# Patient Record
Sex: Female | Born: 1955 | Race: White | Hispanic: No | Marital: Married | Smoking: Former smoker
Health system: Southern US, Community
[De-identification: ages and names within clinical notes are randomized; demographics above are authoritative.]

## PROBLEM LIST (undated history)

## (undated) DIAGNOSIS — E038 Other specified hypothyroidism: Secondary | ICD-10-CM

## (undated) DIAGNOSIS — E782 Mixed hyperlipidemia: Secondary | ICD-10-CM

## (undated) HISTORY — DX: Other specified hypothyroidism: E03.8

## (undated) HISTORY — DX: Mixed hyperlipidemia: E78.2

---

## 1989-09-03 HISTORY — PX: CHOLECYSTECTOMY: SHX55

## 1991-09-04 HISTORY — PX: HYSTERECTOMY ABDOMINAL WITH SALPINGECTOMY: SHX6725

## 1998-08-11 ENCOUNTER — Encounter: Payer: Self-pay | Admitting: Obstetrics & Gynecology

## 1998-08-11 ENCOUNTER — Ambulatory Visit (HOSPITAL_COMMUNITY): Admission: RE | Admit: 1998-08-11 | Discharge: 1998-08-11 | Payer: Self-pay | Admitting: Obstetrics & Gynecology

## 1998-10-10 ENCOUNTER — Encounter: Payer: Self-pay | Admitting: *Deleted

## 1998-10-10 ENCOUNTER — Ambulatory Visit (HOSPITAL_COMMUNITY): Admission: RE | Admit: 1998-10-10 | Discharge: 1998-10-10 | Payer: Self-pay | Admitting: *Deleted

## 1999-03-01 ENCOUNTER — Ambulatory Visit (HOSPITAL_COMMUNITY): Admission: RE | Admit: 1999-03-01 | Discharge: 1999-03-01 | Payer: Self-pay | Admitting: Obstetrics & Gynecology

## 2000-02-26 ENCOUNTER — Ambulatory Visit (HOSPITAL_COMMUNITY): Admission: RE | Admit: 2000-02-26 | Discharge: 2000-02-26 | Payer: Self-pay | Admitting: Obstetrics and Gynecology

## 2000-02-26 ENCOUNTER — Encounter: Payer: Self-pay | Admitting: Obstetrics and Gynecology

## 2001-02-27 ENCOUNTER — Ambulatory Visit (HOSPITAL_COMMUNITY): Admission: RE | Admit: 2001-02-27 | Discharge: 2001-02-27 | Payer: Self-pay | Admitting: Obstetrics and Gynecology

## 2001-02-27 ENCOUNTER — Encounter: Payer: Self-pay | Admitting: Obstetrics and Gynecology

## 2002-03-20 ENCOUNTER — Ambulatory Visit (HOSPITAL_COMMUNITY): Admission: RE | Admit: 2002-03-20 | Discharge: 2002-03-20 | Payer: Self-pay | Admitting: Obstetrics and Gynecology

## 2002-03-20 ENCOUNTER — Encounter: Payer: Self-pay | Admitting: Obstetrics and Gynecology

## 2003-03-22 ENCOUNTER — Encounter: Payer: Self-pay | Admitting: Obstetrics and Gynecology

## 2003-03-22 ENCOUNTER — Ambulatory Visit (HOSPITAL_COMMUNITY): Admission: RE | Admit: 2003-03-22 | Discharge: 2003-03-22 | Payer: Self-pay | Admitting: Obstetrics and Gynecology

## 2003-09-04 HISTORY — PX: KNEE SURGERY: SHX244

## 2004-04-07 ENCOUNTER — Ambulatory Visit (HOSPITAL_COMMUNITY): Admission: RE | Admit: 2004-04-07 | Discharge: 2004-04-07 | Payer: Self-pay | Admitting: Obstetrics and Gynecology

## 2005-04-13 ENCOUNTER — Ambulatory Visit (HOSPITAL_COMMUNITY): Admission: RE | Admit: 2005-04-13 | Discharge: 2005-04-13 | Payer: Self-pay | Admitting: Obstetrics and Gynecology

## 2006-04-19 ENCOUNTER — Ambulatory Visit (HOSPITAL_COMMUNITY): Admission: RE | Admit: 2006-04-19 | Discharge: 2006-04-19 | Payer: Self-pay | Admitting: Obstetrics and Gynecology

## 2006-04-26 ENCOUNTER — Encounter: Admission: RE | Admit: 2006-04-26 | Discharge: 2006-04-26 | Payer: Self-pay | Admitting: Obstetrics and Gynecology

## 2006-10-11 ENCOUNTER — Encounter: Admission: RE | Admit: 2006-10-11 | Discharge: 2006-10-11 | Payer: Self-pay | Admitting: Obstetrics and Gynecology

## 2007-04-25 ENCOUNTER — Encounter: Admission: RE | Admit: 2007-04-25 | Discharge: 2007-04-25 | Payer: Self-pay | Admitting: Obstetrics and Gynecology

## 2008-03-30 ENCOUNTER — Ambulatory Visit (HOSPITAL_COMMUNITY): Admission: RE | Admit: 2008-03-30 | Discharge: 2008-03-30 | Payer: Self-pay | Admitting: Obstetrics and Gynecology

## 2008-04-30 ENCOUNTER — Encounter: Admission: RE | Admit: 2008-04-30 | Discharge: 2008-04-30 | Payer: Self-pay | Admitting: Obstetrics and Gynecology

## 2009-05-13 ENCOUNTER — Encounter: Admission: RE | Admit: 2009-05-13 | Discharge: 2009-05-13 | Payer: Self-pay | Admitting: Obstetrics and Gynecology

## 2009-12-13 IMAGING — CT CT ABDOMEN W/ CM
2 of 5 series · 17 of 46 positions shown, 19 images · IV contrast (agent unspecified)
Comparison: None

CT ABDOMEN

CLINICAL DATA: Left lower quadrant abdominal and pelvic pain.

CT ABDOMEN AND PELVIS WITH CONTRAST
TECHNIQUE: Multidetector CT imaging of the abdomen and pelvis was
performed using the standard protocol following bolus
administration of intravenous contrast.
Contrast: 100 ml Optiray 300 and oral contrast

[Series 2: abd_pel 5.0 b40s · axial · 0.68mm/px · z∈[-462,-86]mm · 14 of 85 slices shown, 16 images]
[im 5/85  soft-tissue]
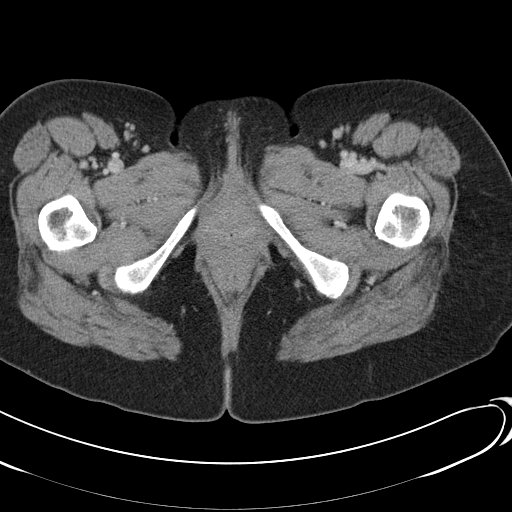
[im 5/85  bone]
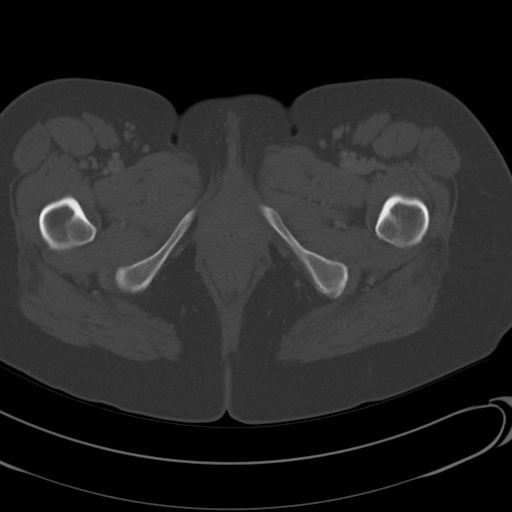
[im 10/85  soft-tissue]
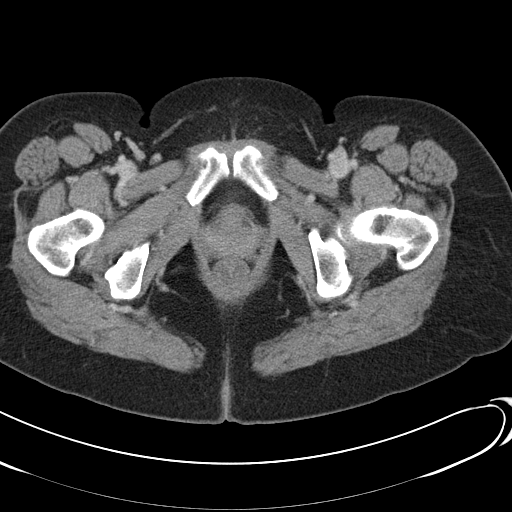
[im 19/85  soft-tissue]
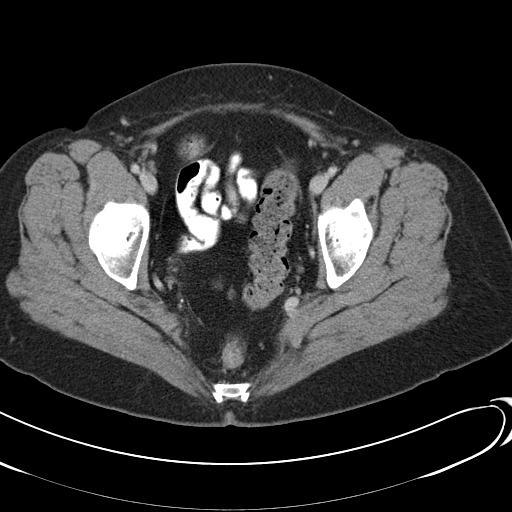
[im 24/85  soft-tissue]
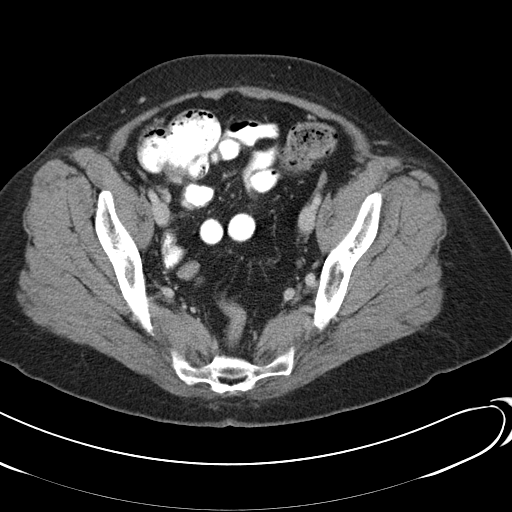
[im 29/85  soft-tissue]
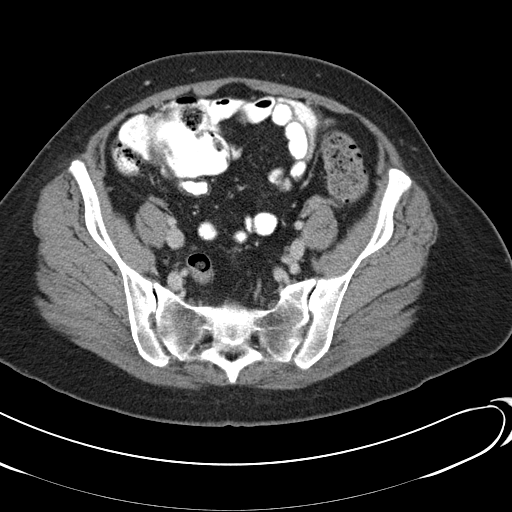
[im 33/85  soft-tissue]
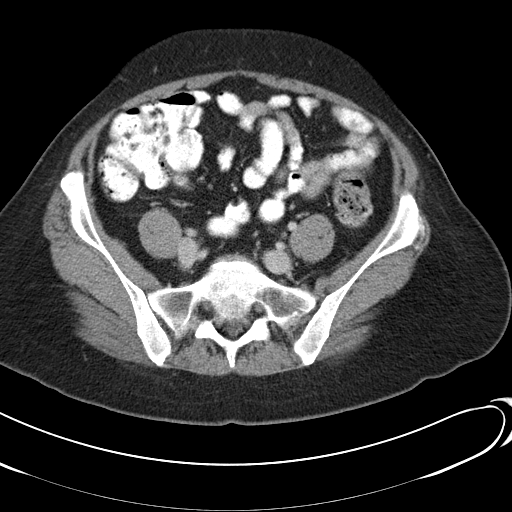
[im 38/85  soft-tissue]
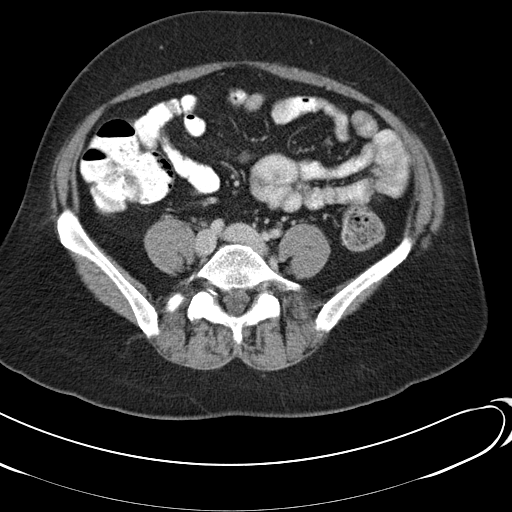
[im 47/85  soft-tissue]
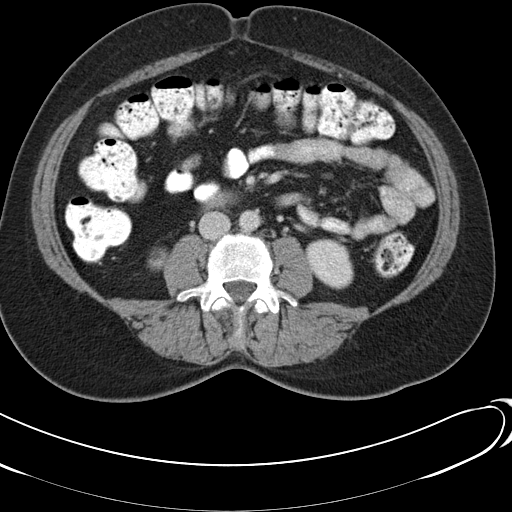
[im 52/85  soft-tissue]
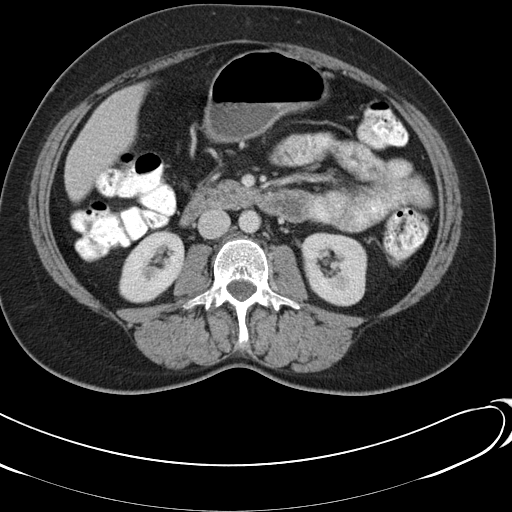
[im 52/85  bone]
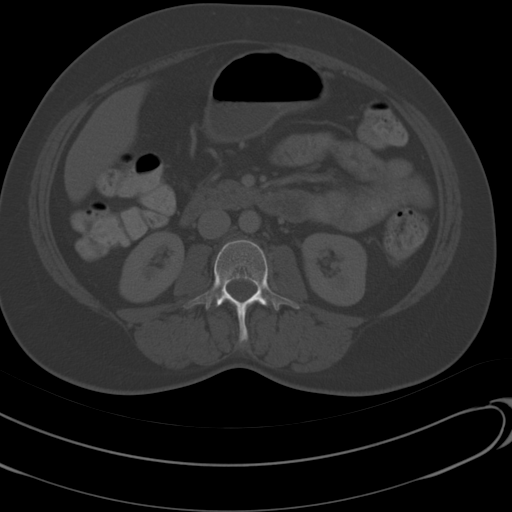
[im 57/85  soft-tissue]
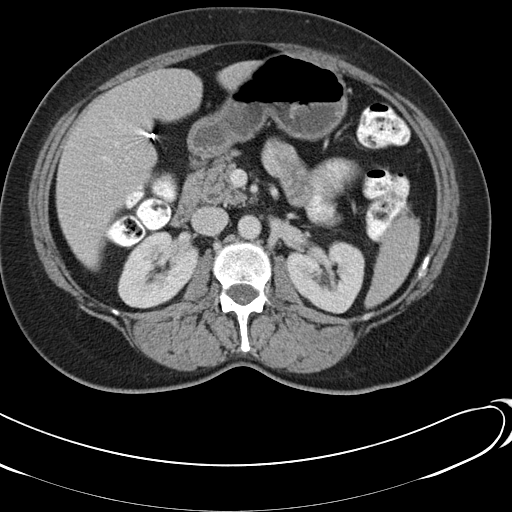
[im 61/85  soft-tissue]
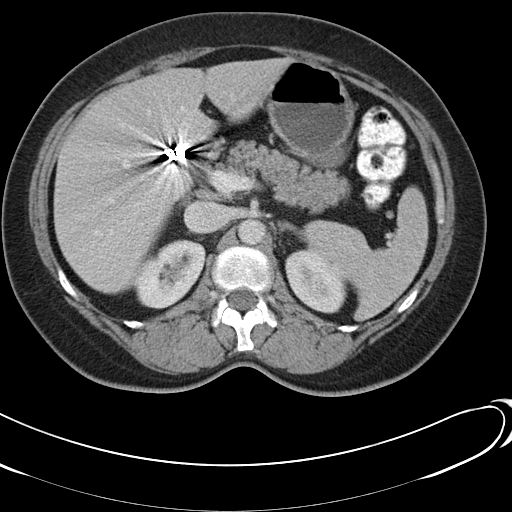
[im 66/85  soft-tissue]
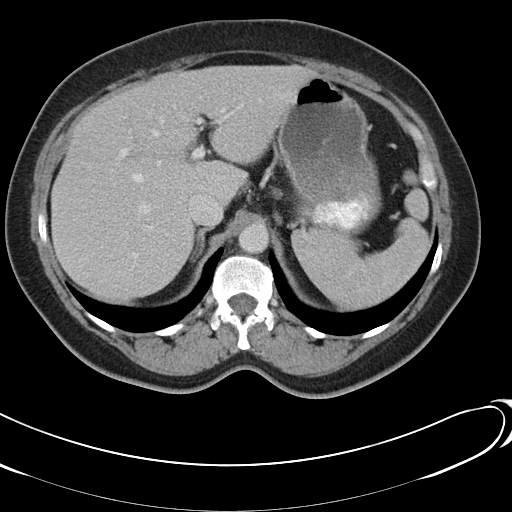
[im 75/85  soft-tissue]
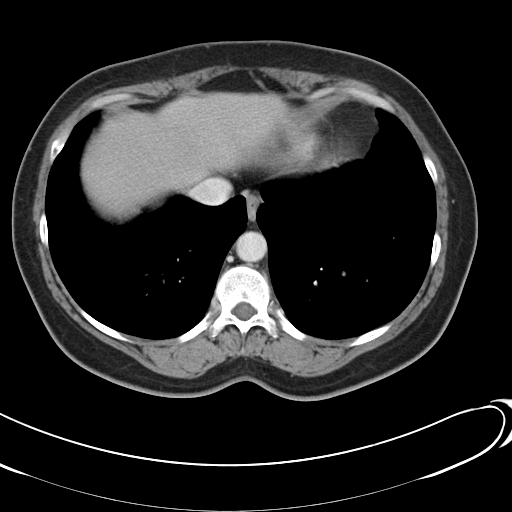
[im 80/85  soft-tissue]
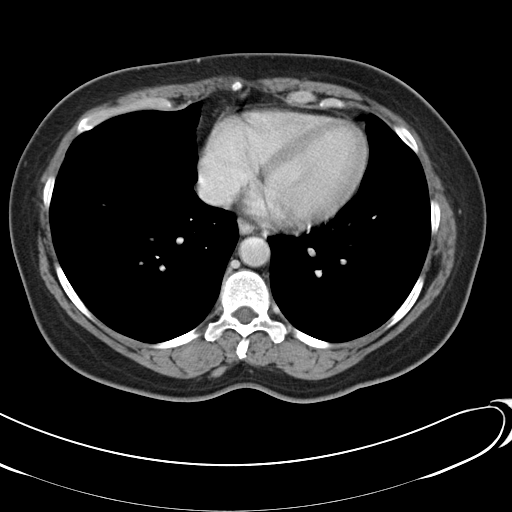

[Series 602: <mpr thick range> · coronal · 0.83mm/px · 3 of 86 slices shown]
[im 29/86  soft-tissue]
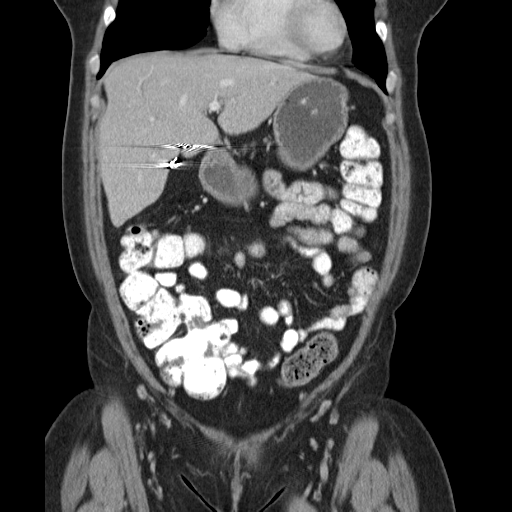
[im 38/86  soft-tissue]
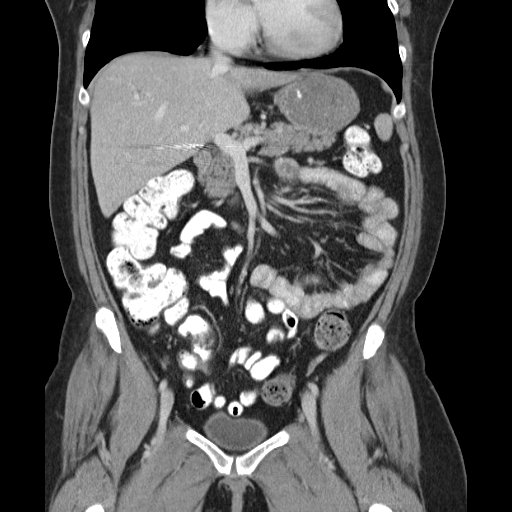
[im 48/86  soft-tissue]
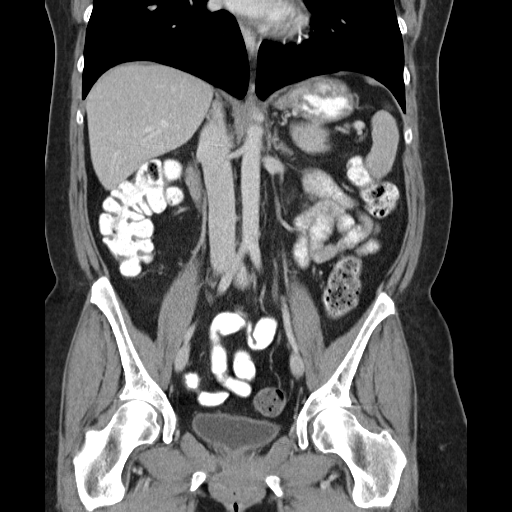

[17 of 46 positions shown; findings below may reference images not displayed]

FINDINGS: Surgical clips are seen from prior cholecystectomy.
There is no evidence of biliary ductal dilatation.  The liver,
spleen, pancreas, adrenal glands, and kidneys are normal
appearance.

There is no evidence of abdominal soft tissue masses or
lymphadenopathy.  There is no evidence of inflammatory process or
abnormal fluid collections.  Abdominal bowel loops are
unremarkable.
IMPRESSION: Negative abdomen CT.  Prior cholecystectomy noted.

CT PELVIS
FINDINGS: There are is no evidence of pelvic mass or
lymphadenopathy.  There is no evidence of inflammatory process or
abnormal fluid collections.  The patient has undergone previous
hysterectomy.  Pelvic bowel loops and other organs are unremarkable
in appearance.
IMPRESSION: Negative pelvis CT.  Prior hysterectomy noted.

## 2010-05-19 ENCOUNTER — Encounter: Admission: RE | Admit: 2010-05-19 | Discharge: 2010-05-19 | Payer: Self-pay | Admitting: Obstetrics and Gynecology

## 2010-09-24 ENCOUNTER — Encounter: Payer: Self-pay | Admitting: Rheumatology

## 2011-04-17 ENCOUNTER — Other Ambulatory Visit: Payer: Self-pay | Admitting: Obstetrics and Gynecology

## 2011-04-17 DIAGNOSIS — Z1231 Encounter for screening mammogram for malignant neoplasm of breast: Secondary | ICD-10-CM

## 2011-05-25 ENCOUNTER — Ambulatory Visit
Admission: RE | Admit: 2011-05-25 | Discharge: 2011-05-25 | Disposition: A | Discharge status: Home / Self Care | Payer: BC Managed Care – PPO | Source: Ambulatory Visit | Attending: Obstetrics and Gynecology | Admitting: Obstetrics and Gynecology

## 2011-05-25 DIAGNOSIS — Z1231 Encounter for screening mammogram for malignant neoplasm of breast: Secondary | ICD-10-CM

## 2012-05-15 ENCOUNTER — Other Ambulatory Visit: Payer: Self-pay | Admitting: Obstetrics and Gynecology

## 2012-05-15 DIAGNOSIS — Z1231 Encounter for screening mammogram for malignant neoplasm of breast: Secondary | ICD-10-CM

## 2012-06-13 ENCOUNTER — Ambulatory Visit
Admission: RE | Admit: 2012-06-13 | Discharge: 2012-06-13 | Disposition: A | Discharge status: Home / Self Care | Payer: BC Managed Care – PPO | Source: Ambulatory Visit | Attending: Obstetrics and Gynecology | Admitting: Obstetrics and Gynecology

## 2012-06-13 DIAGNOSIS — Z1231 Encounter for screening mammogram for malignant neoplasm of breast: Secondary | ICD-10-CM

## 2013-05-11 ENCOUNTER — Other Ambulatory Visit: Payer: Self-pay

## 2013-05-11 DIAGNOSIS — Z1231 Encounter for screening mammogram for malignant neoplasm of breast: Secondary | ICD-10-CM

## 2013-06-19 ENCOUNTER — Ambulatory Visit
Admission: RE | Admit: 2013-06-19 | Discharge: 2013-06-19 | Disposition: A | Discharge status: Home / Self Care | Payer: BC Managed Care – PPO | Source: Ambulatory Visit

## 2013-06-19 DIAGNOSIS — Z1231 Encounter for screening mammogram for malignant neoplasm of breast: Secondary | ICD-10-CM

## 2014-06-22 ENCOUNTER — Other Ambulatory Visit: Payer: Self-pay

## 2014-06-22 DIAGNOSIS — Z1239 Encounter for other screening for malignant neoplasm of breast: Secondary | ICD-10-CM

## 2014-07-14 ENCOUNTER — Other Ambulatory Visit: Payer: Self-pay

## 2014-07-14 DIAGNOSIS — Z1231 Encounter for screening mammogram for malignant neoplasm of breast: Secondary | ICD-10-CM

## 2014-07-15 ENCOUNTER — Ambulatory Visit
Admission: RE | Admit: 2014-07-15 | Discharge: 2014-07-15 | Disposition: A | Discharge status: Home / Self Care | Payer: BC Managed Care – PPO | Source: Ambulatory Visit

## 2014-07-15 DIAGNOSIS — Z1231 Encounter for screening mammogram for malignant neoplasm of breast: Secondary | ICD-10-CM

## 2016-05-08 LAB — HM COLONOSCOPY

## 2019-05-31 DIAGNOSIS — U071 COVID-19: Secondary | ICD-10-CM

## 2019-05-31 HISTORY — DX: COVID-19: U07.1

## 2019-12-08 ENCOUNTER — Other Ambulatory Visit: Payer: Self-pay

## 2019-12-08 MED ORDER — LEVOTHYROXINE SODIUM 100 MCG PO TABS: 100.0000 ug | ORAL_TABLET | Freq: Every day | ORAL | 0 refills | Status: DC

## 2020-03-28 ENCOUNTER — Other Ambulatory Visit: Payer: Self-pay

## 2020-03-28 MED ORDER — LEVOTHYROXINE SODIUM 100 MCG PO TABS: 100.0000 ug | ORAL_TABLET | Freq: Every day | ORAL | 0 refills | Status: DC

## 2020-03-30 ENCOUNTER — Ambulatory Visit (INDEPENDENT_AMBULATORY_CARE_PROVIDER_SITE_OTHER): Payer: Self-pay | Admitting: Legal Medicine

## 2020-03-30 ENCOUNTER — Other Ambulatory Visit: Payer: Self-pay

## 2020-03-30 ENCOUNTER — Encounter: Payer: Self-pay | Admitting: Legal Medicine

## 2020-03-30 DIAGNOSIS — E782 Mixed hyperlipidemia: Secondary | ICD-10-CM | POA: Insufficient documentation

## 2020-03-30 DIAGNOSIS — E038 Other specified hypothyroidism: Secondary | ICD-10-CM

## 2020-03-30 NOTE — Progress Notes (Signed)
Subjective:  Patient ID: Molly Carroll, female    DOB: May 23, 1956  Age: 64 y.o. MRN: [id number]  Chief Complaint  Patient presents with  . Hypothyroidism  . Hyperlipidemia    HPI: chronic visit  Patient presents with hyperlipidemia.  Compliance with treatment has been good; patient takes medicines as directed, maintains low cholesterol diet, follows up as directed, and maintains exercise regimen.  Patient is using diet and exercise without problems.  Patient has HYPOTHYROIDISM.  Diagnosed 10 years ago.  Patient has stable thyroid readings.  Patient is having stable.  Last TSH was norma.  continue dosage of thyroid medicine.   Current Outpatient Medications on File Prior to Visit  Medication Sig Dispense Refill  . levothyroxine (SYNTHROID) 100 MCG tablet Take 1 tablet (100 mcg total) by mouth daily before breakfast. 90 tablet 0   No current facility-administered medications on file prior to visit.   Past Medical History:  Diagnosis Date  . COVID-19 05/31/2019  . Mixed hyperlipidemia   . Other specified hypothyroidism    Past Surgical History:  Procedure Laterality Date  . CHOLECYSTECTOMY  1991  . HYSTERECTOMY ABDOMINAL WITH SALPINGECTOMY  1993  . KNEE SURGERY Left 2005    Family History  Problem Relation Age of Onset  . Aortic aneurysm Mother   . Prostate cancer Father    Social History   Socioeconomic History  . Marital status: Married    Spouse name: Not on file  . Number of children: 2  . Years of education: Not on file  . Highest education level: Not on file  Occupational History  . Occupation: Housewife  Tobacco Use  . Smoking status: Former Smoker    Types: Cigarettes    Quit date: 2007    Years since quitting: 14.5  . Smokeless tobacco: Never Used  Substance and Sexual Activity  . Alcohol use: Not Currently  . Drug use: Never  . Sexual activity: Yes    Partners: Male  Other Topics Concern  . Not on file  Social History Narrative  . Not on file    Social Determinants of Health   Financial Resource Strain:   . Difficulty of Paying Living Expenses:   Food Insecurity:   . Worried About Programme researcher, broadcasting/film/video in the Last Year:   . Barista in the Last Year:   Transportation Needs:   . Freight forwarder (Medical):   Marland Kitchen Lack of Transportation (Non-Medical):   Physical Activity:   . Days of Exercise per Week:   . Minutes of Exercise per Session:   Stress:   . Feeling of Stress :   Social Connections:   . Frequency of Communication with Friends and Family:   . Frequency of Social Gatherings with Friends and Family:   . Attends Religious Services:   . Active Member of Clubs or Organizations:   . Attends Banker Meetings:   Marland Kitchen Marital Status:     Review of Systems  Constitutional: Negative.   HENT: Negative.   Eyes: Negative.   Respiratory: Negative.   Cardiovascular: Negative.   Gastrointestinal: Negative.   Endocrine: Negative.   Genitourinary: Negative.   Musculoskeletal: Negative.   Skin: Negative.   Neurological: Negative.   Psychiatric/Behavioral: Negative.      Objective:  BP (!) 112/60 (BP Location: Right Arm, Patient Position: Sitting)   Pulse 88   Temp (!) 97.5 F (36.4 C) (Temporal)   Resp 16   Ht 5\' 2"  (1.575 m)  Wt 159 lb (72.1 kg)   SpO2 95%   BMI 29.08 kg/m   BP/Weight 03/30/2020  Systolic BP 112  Diastolic BP 60  Wt. (Lbs) 159  BMI 29.08    Physical Exam Vitals reviewed.  Constitutional:      Appearance: Normal appearance.  HENT:     Right Ear: Tympanic membrane, ear canal and external ear normal.     Left Ear: Tympanic membrane, ear canal and external ear normal.  Eyes:     Extraocular Movements: Extraocular movements intact.     Conjunctiva/sclera: Conjunctivae normal.     Pupils: Pupils are equal, round, and reactive to light.  Cardiovascular:     Rate and Rhythm: Normal rate and regular rhythm.     Pulses: Normal pulses.     Heart sounds: Normal heart  sounds.  Pulmonary:     Effort: Pulmonary effort is normal.     Breath sounds: Normal breath sounds.  Abdominal:     General: Abdomen is flat. Bowel sounds are normal.     Palpations: Abdomen is soft.  Musculoskeletal:        General: Normal range of motion.     Cervical back: Normal range of motion and neck supple.  Skin:    General: Skin is warm and dry.     Capillary Refill: Capillary refill takes less than 2 seconds.  Neurological:     General: No focal deficit present.     Mental Status: She is alert and oriented to person, place, and time.  Psychiatric:        Mood and Affect: Mood normal.        Thought Content: Thought content normal.     Diabetic Foot Exam - Simple   No data filed       No results found for: WBC, HGB, HCT, PLT, GLUCOSE, CHOL, TRIG, HDL, LDLDIRECT, LDLCALC, ALT, AST, NA, K, CL, CREATININE, BUN, CO2, TSH, PSA, INR, GLUF, HGBA1C, MICROALBUR    Assessment & Plan:   1. Other specified hypothyroidism - TSH Patient is known to have hypothroidism and is n treatment with levothyroxine .  Patient was diagnosed 10 years ago.  Other treatment includes none.  Patient is compliant with medicines and last TSH 6 months ago.  Last TSH was normal.  2. Mixed hyperlipidemia - Lipid Panel - Comprehensive metabolic panel AN INDIVIDUAL CARE PLAN for hyperlipidemia/ cholesterol was established and reinforced today.  The patient's status was assessed using clinical findings on exam, lab and other diagnostic tests. The patient's disease status was assessed based on evidence-based guidelines and found to be well controlled. MEDICATIONS were reviewed. SELF MANAGEMENT GOALS have been discussed and patient's success at attaining the goal of low cholesterol was assessed. RECOMMENDATION given include regular exercise 3 days a week and low cholesterol/low fat diet. CLINICAL SUMMARY including written plan to identify barriers unique to the patient due to social or economic   reasons was discussed.      Orders Placed This Encounter  Procedures  . TSH  . Lipid Panel  . Comprehensive metabolic panel     Follow-up: No follow-ups on file.  An After Visit Summary was printed and given to the patient.  Brent Bulla [facility] [phone]

## 2020-03-31 LAB — CBC WITH DIFFERENTIAL/PLATELET
Basophils Absolute: 0 10*3/uL (ref 0.0–0.2)
Basos: 1 %
EOS (ABSOLUTE): 0.1 10*3/uL (ref 0.0–0.4)
Eos: 1 %
Hematocrit: 40.1 % (ref 34.0–46.6)
Hemoglobin: 13.6 g/dL (ref 11.1–15.9)
Immature Grans (Abs): 0.1 10*3/uL (ref 0.0–0.1)
Immature Granulocytes: 1 %
Lymphocytes Absolute: 3.2 10*3/uL — ABNORMAL HIGH (ref 0.7–3.1)
Lymphs: 40 %
MCH: 30 pg (ref 26.6–33.0)
MCHC: 33.9 g/dL (ref 31.5–35.7)
MCV: 89 fL (ref 79–97)
Monocytes Absolute: 0.8 10*3/uL (ref 0.1–0.9)
Monocytes: 9 %
Neutrophils Absolute: 3.9 10*3/uL (ref 1.4–7.0)
Neutrophils: 48 %
Platelets: 341 10*3/uL (ref 150–450)
RBC: 4.53 x10E6/uL (ref 3.77–5.28)
RDW: 13 % (ref 11.7–15.4)
WBC: 8 10*3/uL (ref 3.4–10.8)

## 2020-03-31 LAB — COMPREHENSIVE METABOLIC PANEL
ALT: 18 IU/L (ref 0–32)
AST: 20 IU/L (ref 0–40)
Albumin/Globulin Ratio: 1.6 (ref 1.2–2.2)
Albumin: 4.7 g/dL (ref 3.8–4.8)
Alkaline Phosphatase: 80 IU/L (ref 48–121)
BUN/Creatinine Ratio: 15 (ref 12–28)
BUN: 13 mg/dL (ref 8–27)
Bilirubin Total: 0.4 mg/dL (ref 0.0–1.2)
CO2: 26 mmol/L (ref 20–29)
Calcium: 9.9 mg/dL (ref 8.7–10.3)
Chloride: 101 mmol/L (ref 96–106)
Creatinine, Ser: 0.89 mg/dL (ref 0.57–1.00)
GFR calc Af Amer: 79 mL/min/{1.73_m2} (ref >59)
GFR calc non Af Amer: 69 mL/min/{1.73_m2} (ref >59)
Globulin, Total: 3 g/dL (ref 1.5–4.5)
Glucose: 85 mg/dL (ref 65–99)
Potassium: 5.2 mmol/L (ref 3.5–5.2)
Sodium: 139 mmol/L (ref 134–144)
Total Protein: 7.7 g/dL (ref 6.0–8.5)

## 2020-03-31 LAB — LIPID PANEL
Chol/HDL Ratio: 4.8 ratio — ABNORMAL HIGH (ref 0.0–4.4)
Cholesterol, Total: 240 mg/dL — ABNORMAL HIGH (ref 100–199)
HDL: 50 mg/dL (ref >39)
LDL Chol Calc (NIH): 159 mg/dL — ABNORMAL HIGH (ref 0–99)
Triglycerides: 173 mg/dL — ABNORMAL HIGH (ref 0–149)
VLDL Cholesterol Cal: 31 mg/dL (ref 5–40)

## 2020-03-31 LAB — TSH: TSH: 0.689 u[IU]/mL (ref 0.450–4.500)

## 2020-03-31 LAB — CARDIOVASCULAR RISK ASSESSMENT

## 2020-03-31 NOTE — Progress Notes (Signed)
TSH 0.699 normal, LDL Cholesterol ( bad cholesterol ) is very high 159- need to start on low cholesterol diet -send in My Chart, may need to consider statin, kidney and liver tests normal, CBC normal lp

## 2020-06-27 ENCOUNTER — Other Ambulatory Visit: Payer: Self-pay

## 2020-06-27 DIAGNOSIS — E038 Other specified hypothyroidism: Secondary | ICD-10-CM

## 2020-06-27 MED ORDER — LEVOTHYROXINE SODIUM 100 MCG PO TABS: 100.0000 ug | ORAL_TABLET | Freq: Every day | ORAL | 0 refills | Status: DC

## 2020-09-21 ENCOUNTER — Encounter: Payer: Self-pay | Admitting: Legal Medicine

## 2020-09-21 ENCOUNTER — Telehealth (INDEPENDENT_AMBULATORY_CARE_PROVIDER_SITE_OTHER): Payer: Self-pay | Admitting: Legal Medicine

## 2020-09-21 DIAGNOSIS — J329 Chronic sinusitis, unspecified: Secondary | ICD-10-CM | POA: Insufficient documentation

## 2020-09-21 DIAGNOSIS — J019 Acute sinusitis, unspecified: Secondary | ICD-10-CM

## 2020-09-21 MED ORDER — AZITHROMYCIN 250 MG PO TABS: ORAL_TABLET | ORAL | 0 refills | Status: DC

## 2020-09-21 NOTE — Progress Notes (Signed)
Virtual Visit via Video Note   This visit type was conducted due to national recommendations for restrictions regarding the COVID-19 Pandemic (e.g. social distancing) in an effort to limit this patient's exposure and mitigate transmission in our community.  Due to her co-morbid illnesses, this patient is at least at moderate risk for complications without adequate follow up.  This format is felt to be most appropriate for this patient at this time.  All issues noted in this document were discussed and addressed.  A limited physical exam was performed with this format.  A verbal consent was obtained for the virtual visit.   Date:  09/21/2020   ID:  Molly Carroll, DOB 1956/07/16, MRN [id number]  Patient Location: Home Provider Location: Office/Clinic  PCP:  Abigail Miyamoto, MD   Evaluation Performed:  New Patient Evaluation  Chief Complaint: congestion    History of Present Illness:    Molly Carroll is a 65 y.o. female with Pt states symptoms started Sunday. Complains nasal congestion, cough, headache, sore throat,  Denies chest congestion, fever, rash, diarrhea, urinary symptoms,   Did not have covid shots, Had Covid in September 2020. No flu shot. No known exposure. No one in household sick.    The patient does have symptoms concerning for COVID-19 infection (fever, chills, cough, or new shortness of breath).    Past Medical History:  Diagnosis Date  . COVID-19 05/31/2019  . Mixed hyperlipidemia   . Other specified hypothyroidism     Past Surgical History:  Procedure Laterality Date  . CHOLECYSTECTOMY  1991  . HYSTERECTOMY ABDOMINAL WITH SALPINGECTOMY  1993  . KNEE SURGERY Left 2005    Family History  Problem Relation Age of Onset  . Aortic aneurysm Mother   . Prostate cancer Father     Social History   Socioeconomic History  . Marital status: Married    Spouse name: Not on file  . Number of children: 2  . Years of education: Not on file  . Highest  education level: Not on file  Occupational History  . Occupation: Housewife  Tobacco Use  . Smoking status: Former Smoker    Types: Cigarettes    Quit date: 2007    Years since quitting: 15.0  . Smokeless tobacco: Never Used  Substance and Sexual Activity  . Alcohol use: Not Currently  . Drug use: Never  . Sexual activity: Yes    Partners: Male  Other Topics Concern  . Not on file  Social History Narrative  . Not on file   Social Determinants of Health   Financial Resource Strain: Not on file  Food Insecurity: Not on file  Transportation Needs: Not on file  Physical Activity: Not on file  Stress: Not on file  Social Connections: Not on file  Intimate Partner Violence: Not on file    Outpatient Medications Prior to Visit  Medication Sig Dispense Refill  . levothyroxine (SYNTHROID) 100 MCG tablet Take 1 tablet (100 mcg total) by mouth daily before breakfast. 90 tablet 0   No facility-administered medications prior to visit.    Allergies:   Patient has no known allergies.   Social History   Tobacco Use  . Smoking status: Former Smoker    Types: Cigarettes    Quit date: 2007    Years since quitting: 15.0  . Smokeless tobacco: Never Used  Substance Use Topics  . Alcohol use: Not Currently  . Drug use: Never     Review of Systems  Constitutional: Negative for chills and fever.  HENT: Positive for congestion and sinus pain. Negative for sore throat.   Eyes: Negative for redness.  Respiratory: Positive for cough.   Cardiovascular: Negative for chest pain and PND.  Gastrointestinal: Negative for melena.  Genitourinary: Negative for flank pain.  Musculoskeletal: Negative for myalgias.  Neurological: Negative for dizziness and headaches.  Psychiatric/Behavioral: Negative.      Labs/Other Tests and Data Reviewed:    Recent Labs: 03/30/2020: ALT 18; BUN 13; Creatinine, Ser 0.89; Hemoglobin 13.6; Platelets 341; Potassium 5.2; Sodium 139; TSH 0.689   Recent Lipid  Panel Lab Results  Component Value Date/Time   CHOL 240 (H) 03/30/2020 10:11 AM   TRIG 173 (H) 03/30/2020 10:11 AM   HDL 50 03/30/2020 10:11 AM   CHOLHDL 4.8 (H) 03/30/2020 10:11 AM   LDLCALC 159 (H) 03/30/2020 10:11 AM    Wt Readings from Last 3 Encounters:  09/21/20 152 lb (68.9 kg)  03/30/20 159 lb (72.1 kg)     Objective:    Vital Signs:  BP 110/67   Pulse 93   Temp (!) 96.3 F (35.7 C)   Ht 5\' 2"  (1.575 m)   Wt 152 lb (68.9 kg)   SpO2 97%   BMI 27.80 kg/m    Physical Exam vs stable  ASSESSMENT & PLAN:   Diagnoses and all orders for this visit: Acute non-recurrent sinusitis, unspecified location -     azithromycin (ZITHROMAX) 250 MG tablet; 2 tablets on day 1, then 1 tablet daily on days 2-6. patient refuses to come in for covid tests       COVID-19 Education: The signs and symptoms of COVID-19 were discussed with the patient and how to seek care for testing (follow up with PCP or arrange E-visit). The importance of social distancing was discussed today.   I spent dedicated to the care of this patient on the date of this encounter to include face-to-face time with the patient, as well as:   Follow Up:  In Person prn  Signed, , [facility]  09/21/2020 8:54 AM    [facility] [place]

## 2020-09-27 ENCOUNTER — Other Ambulatory Visit: Payer: Self-pay

## 2020-09-27 DIAGNOSIS — E038 Other specified hypothyroidism: Secondary | ICD-10-CM

## 2020-09-27 MED ORDER — LEVOTHYROXINE SODIUM 100 MCG PO TABS: 100.0000 ug | ORAL_TABLET | Freq: Every day | ORAL | 2 refills | Status: DC

## 2020-09-30 ENCOUNTER — Other Ambulatory Visit: Payer: Self-pay

## 2020-09-30 ENCOUNTER — Encounter: Payer: Self-pay | Admitting: Legal Medicine

## 2020-09-30 ENCOUNTER — Ambulatory Visit (INDEPENDENT_AMBULATORY_CARE_PROVIDER_SITE_OTHER): Payer: Self-pay | Admitting: Legal Medicine

## 2020-09-30 VITALS — BP 110/68 | HR 90 | Temp 97.4°F | Resp 18 | Ht 62.0 in | Wt 156.8 lb

## 2020-09-30 DIAGNOSIS — E038 Other specified hypothyroidism: Secondary | ICD-10-CM

## 2020-09-30 DIAGNOSIS — E782 Mixed hyperlipidemia: Secondary | ICD-10-CM

## 2020-09-30 NOTE — Progress Notes (Signed)
Subjective:  Patient ID: Molly Carroll, female    DOB: 11/28/1955  Age: 65 y.o. MRN: [id number]  Chief Complaint  Patient presents with  . Hyperlipidemia  . Hypothyroidism    HPI : Chronic visit  Patient presents with hyperlipidemia.  Compliance with treatment has been good; patient takes medicines as directed, maintains low cholesterol diet, follows up as directed, and maintains exercise regimen.  Patient is using none without problems.  Patient has HYPOTHYROIDISM.  Diagnosed 20 years ago.  Patient has stable thyroid readings.  Patient is having no symptoms.  Last TSH was none.  continue dosage of thyroid medicine. Current Outpatient Medications on File Prior to Visit  Medication Sig Dispense Refill  . levothyroxine (SYNTHROID) 100 MCG tablet Take 1 tablet (100 mcg total) by mouth daily before breakfast. 90 tablet 2   No current facility-administered medications on file prior to visit.   Past Medical History:  Diagnosis Date  . COVID-19 05/31/2019  . Mixed hyperlipidemia   . Other specified hypothyroidism    Past Surgical History:  Procedure Laterality Date  . CHOLECYSTECTOMY  1991  . HYSTERECTOMY ABDOMINAL WITH SALPINGECTOMY  1993  . KNEE SURGERY Left 2005    Family History  Problem Relation Age of Onset  . Aortic aneurysm Mother   . Prostate cancer Father    Social History   Socioeconomic History  . Marital status: Married    Spouse name: Not on file  . Number of children: 2  . Years of education: Not on file  . Highest education level: Not on file  Occupational History  . Occupation: Housewife  Tobacco Use  . Smoking status: Former Smoker    Types: Cigarettes    Quit date: 2007    Years since quitting: 15.0  . Smokeless tobacco: Never Used  Substance and Sexual Activity  . Alcohol use: Not Currently  . Drug use: Never  . Sexual activity: Yes    Partners: Male  Other Topics Concern  . Not on file  Social History Narrative  . Not on file   Social  Determinants of Health   Financial Resource Strain: Not on file  Food Insecurity: Not on file  Transportation Needs: Not on file  Physical Activity: Not on file  Stress: Not on file  Social Connections: Not on file    Review of Systems  Constitutional: Negative for activity change and fatigue.  HENT: Negative for congestion and sinus pain.   Eyes: Negative for visual disturbance.  Respiratory: Negative for chest tightness and shortness of breath.   Cardiovascular: Negative for chest pain, palpitations and leg swelling.  Gastrointestinal: Negative for abdominal distention and abdominal pain.  Endocrine: Negative for polyuria.  Genitourinary: Negative for difficulty urinating, dysuria and urgency.  Musculoskeletal: Negative for arthralgias and back pain.  Skin: Negative.   Neurological: Negative.   Psychiatric/Behavioral: Negative.      Objective:  BP 110/68 (BP Location: Left Arm, Patient Position: Sitting, Cuff Size: Normal)   Pulse 90   Temp (!) 97.4 F (36.3 C) (Temporal)   Resp 18   Ht 5\' 2"  (1.575 m)   Wt 156 lb 12.8 oz (71.1 kg)   SpO2 98%   BMI 28.68 kg/m   BP/Weight 09/30/2020 09/21/2020 03/30/2020  Systolic BP 110 110 112  Diastolic BP 68 67 60  Wt. (Lbs) 156.8 152 159  BMI 28.68 27.8 29.08    Physical Exam Vitals reviewed.  Constitutional:      Appearance: Normal appearance.  HENT:  Head: Normocephalic.     Right Ear: Tympanic membrane normal.     Left Ear: Tympanic membrane normal.     Nose: Nose normal.     Mouth/Throat:     Mouth: Mucous membranes are moist.     Pharynx: Oropharynx is clear.  Eyes:     Extraocular Movements: Extraocular movements intact.     Conjunctiva/sclera: Conjunctivae normal.     Pupils: Pupils are equal, round, and reactive to light.  Neck:     Comments: Thyroid normAL Cardiovascular:     Rate and Rhythm: Normal rate and regular rhythm.     Pulses: Normal pulses.     Heart sounds: Normal heart sounds. No murmur  heard. No gallop.   Pulmonary:     Effort: Pulmonary effort is normal. No respiratory distress.     Breath sounds: Normal breath sounds. No rales.  Abdominal:     General: Abdomen is flat. Bowel sounds are normal. There is no distension.     Tenderness: There is no abdominal tenderness.  Musculoskeletal:        General: Normal range of motion.     Cervical back: Normal range of motion and neck supple.  Skin:    General: Skin is warm and dry.     Capillary Refill: Capillary refill takes less than 2 seconds.  Neurological:     General: No focal deficit present.     Mental Status: She is alert. Mental status is at baseline. She is disoriented.     Sensory: No sensory deficit.       Lab Results  Component Value Date   WBC 8.4 09/30/2020   HGB 13.3 09/30/2020   HCT 40.4 09/30/2020   PLT 359 09/30/2020   GLUCOSE 87 09/30/2020   CHOL 210 (H) 09/30/2020   TRIG 150 (H) 09/30/2020   HDL 50 09/30/2020   LDLCALC 133 (H) 09/30/2020   ALT 18 09/30/2020   AST 24 09/30/2020   NA 140 09/30/2020   K 4.8 09/30/2020   CL 101 09/30/2020   CREATININE 0.97 09/30/2020   BUN 15 09/30/2020   CO2 26 09/30/2020   TSH 1.730 09/30/2020      Assessment & Plan:   1. Other specified hypothyroidism Patient is known to have hypothyroidism and is n treatment with levothyroxine .  Patient was diagnosed 20 years ago.  Other treatment includes none.  Patient is compliant with medicines and last TSH 6 months ago.  Last TSH was normal. 2. Mixed hyperlipidemia AN INDIVIDUwellAL CARE PLAN for hyperlipidemia/ cholesterol was established and reinforced today.  The patient's status was assessed using clinical findings on exam, lab and other diagnostic tests. The patient's disease status was assessed based on evidence-based guidelines and found to be well controlled. MEDICATIONS were reviewed. SELF MANAGEMENT GOALS have been discussed and patient's success at attaining the goal of low cholesterol was  assessed. RECOMMENDATION given include regular exercise 3 days a week and low cholesterol/low fat diet. CLINICAL SUMMARY including written plan to identify barriers unique to the patient due to social or economic  reasons was discussed.          I spent 20 minutes dedicated to the care of this patient on the date of this encounter to include face-to-face time with the patient, as well as:   Follow-up: Return in about 6 months (around 03/30/2021) for fasting.  An After Visit Summary was printed and given to the patient.  Brent Bulla, MD [facility] [phone]

## 2020-10-01 LAB — COMPREHENSIVE METABOLIC PANEL
ALT: 18 IU/L (ref 0–32)
AST: 24 IU/L (ref 0–40)
Albumin/Globulin Ratio: 1.4 (ref 1.2–2.2)
Albumin: 4.3 g/dL (ref 3.8–4.8)
Alkaline Phosphatase: 79 IU/L (ref 44–121)
BUN/Creatinine Ratio: 15 (ref 12–28)
BUN: 15 mg/dL (ref 8–27)
Bilirubin Total: 0.3 mg/dL (ref 0.0–1.2)
CO2: 26 mmol/L (ref 20–29)
Calcium: 9.4 mg/dL (ref 8.7–10.3)
Chloride: 101 mmol/L (ref 96–106)
Creatinine, Ser: 0.97 mg/dL (ref 0.57–1.00)
GFR calc Af Amer: 71 mL/min/{1.73_m2} (ref >59)
GFR calc non Af Amer: 62 mL/min/{1.73_m2} (ref >59)
Globulin, Total: 3 g/dL (ref 1.5–4.5)
Glucose: 87 mg/dL (ref 65–99)
Potassium: 4.8 mmol/L (ref 3.5–5.2)
Sodium: 140 mmol/L (ref 134–144)
Total Protein: 7.3 g/dL (ref 6.0–8.5)

## 2020-10-01 LAB — LIPID PANEL
Chol/HDL Ratio: 4.2 ratio (ref 0.0–4.4)
Cholesterol, Total: 210 mg/dL — ABNORMAL HIGH (ref 100–199)
HDL: 50 mg/dL (ref >39)
LDL Chol Calc (NIH): 133 mg/dL — ABNORMAL HIGH (ref 0–99)
Triglycerides: 150 mg/dL — ABNORMAL HIGH (ref 0–149)
VLDL Cholesterol Cal: 27 mg/dL (ref 5–40)

## 2020-10-01 LAB — CBC WITH DIFFERENTIAL/PLATELET
Basophils Absolute: 0.1 10*3/uL (ref 0.0–0.2)
Basos: 1 %
EOS (ABSOLUTE): 0.1 10*3/uL (ref 0.0–0.4)
Eos: 1 %
Hematocrit: 40.4 % (ref 34.0–46.6)
Hemoglobin: 13.3 g/dL (ref 11.1–15.9)
Immature Grans (Abs): 0.2 10*3/uL — ABNORMAL HIGH (ref 0.0–0.1)
Immature Granulocytes: 2 %
Lymphocytes Absolute: 3 10*3/uL (ref 0.7–3.1)
Lymphs: 35 %
MCH: 28.9 pg (ref 26.6–33.0)
MCHC: 32.9 g/dL (ref 31.5–35.7)
MCV: 88 fL (ref 79–97)
Monocytes Absolute: 0.7 10*3/uL (ref 0.1–0.9)
Monocytes: 8 %
Neutrophils Absolute: 4.5 10*3/uL (ref 1.4–7.0)
Neutrophils: 53 %
Platelets: 359 10*3/uL (ref 150–450)
RBC: 4.6 x10E6/uL (ref 3.77–5.28)
RDW: 12.9 % (ref 11.7–15.4)
WBC: 8.4 10*3/uL (ref 3.4–10.8)

## 2020-10-01 LAB — TSH: TSH: 1.73 u[IU]/mL (ref 0.450–4.500)

## 2020-10-01 LAB — CARDIOVASCULAR RISK ASSESSMENT

## 2020-10-02 NOTE — Progress Notes (Signed)
CBC normal, kidney and liver tests normal, LDL cholesterol is high 133, TSH 1.73 normal,  lp

## 2020-12-06 ENCOUNTER — Other Ambulatory Visit: Payer: Self-pay

## 2020-12-06 ENCOUNTER — Ambulatory Visit (INDEPENDENT_AMBULATORY_CARE_PROVIDER_SITE_OTHER): Payer: Medicare Other

## 2020-12-06 ENCOUNTER — Encounter: Payer: Self-pay | Admitting: Sports Medicine

## 2020-12-06 ENCOUNTER — Other Ambulatory Visit: Payer: Self-pay | Admitting: Sports Medicine

## 2020-12-06 ENCOUNTER — Ambulatory Visit (INDEPENDENT_AMBULATORY_CARE_PROVIDER_SITE_OTHER): Payer: Medicare Other | Admitting: Sports Medicine

## 2020-12-06 DIAGNOSIS — M779 Enthesopathy, unspecified: Secondary | ICD-10-CM

## 2020-12-06 DIAGNOSIS — M2142 Flat foot [pes planus] (acquired), left foot: Secondary | ICD-10-CM

## 2020-12-06 DIAGNOSIS — M722 Plantar fascial fibromatosis: Secondary | ICD-10-CM

## 2020-12-06 DIAGNOSIS — M25572 Pain in left ankle and joints of left foot: Secondary | ICD-10-CM

## 2020-12-06 DIAGNOSIS — M2141 Flat foot [pes planus] (acquired), right foot: Secondary | ICD-10-CM

## 2020-12-06 MED ORDER — TRIAMCINOLONE ACETONIDE 10 MG/ML IJ SUSP
10.0000 mg | Freq: Once | INTRAMUSCULAR | Status: AC
  Administered 2020-12-06: 10 mg

## 2020-12-06 MED ORDER — PREDNISONE 10 MG (21) PO TBPK: ORAL_TABLET | ORAL | 0 refills | Status: DC

## 2020-12-06 NOTE — Progress Notes (Signed)
Subjective: Molly Carroll is a 65 y.o. female patient presents to office with complaint of moderate heel pain on the left that also extends into her medial arch for the last year 6 out of 10 throbbing pain worse with walking has not tried any treatment.  Patient reports that sometimes it flares it really bad it causes her to limp.  Denies any trauma or recent injury.  Review of system noncontributory.  Patient Active Problem List   Diagnosis Date Noted  . Sinusitis 09/21/2020  . Other specified hypothyroidism   . Mixed hyperlipidemia     Current Outpatient Medications on File Prior to Visit  Medication Sig Dispense Refill  . levothyroxine (SYNTHROID) 100 MCG tablet Take 1 tablet (100 mcg total) by mouth daily before breakfast. 90 tablet 2   No current facility-administered medications on file prior to visit.    No Known Allergies  Objective: Physical Exam General: The patient is alert and oriented x3 in no acute distress.  Dermatology: Skin is warm, dry and supple bilateral lower extremities. Nails 1-10 are normal. There is no erythema, focal edema left foot and ankle, no eccymosis, no open lesions present. Integument is otherwise unremarkable.  Vascular: Dorsalis Pedis pulse and Posterior Tibial pulse are 2/4 bilateral. Capillary fill time is immediate to all digits.  Neurological: Grossly intact to light touch bilateral.  Musculoskeletal: Tenderness to palpation at the medial calcaneal tubercale and through the insertion of the plantar fascia on the left foot.  There is also mild pain along the posterior tibial tendon course of the left foot.  No pain with compression of calcaneus bilateral. All other joints range of motion within normal limits bilateral. Strength 5/5 in all groups bilateral.  Pes planus foot type.  Gait: Unassisted, Antalgic avoid weight on left/right heel  Xray, left foot Normal osseous mineralization. Joint spaces preserved except midtarsal joint with breech  supportive of severe pes planus deformity with arthritis. No fracture/dislocation/boney destruction. Calcaneal spur present with mild thickening of plantar fascia. No other soft tissue abnormalities or radiopaque foreign bodies.   Assessment and Plan: Problem List Items Addressed This Visit   None   Visit Diagnoses    Acute left ankle pain    -  Primary   Relevant Orders   DG Ankle 2 Views Left   Tendonitis       Plantar fasciitis of left foot       Pes planus of both feet          -Complete examination performed.  -Xrays reviewed -Discussed with patient in detail the condition of plantar fasciitis with tendinitis, how this occurs and general treatment options. Explained both conservative and surgical treatments.  -After oral consent and aseptic prep, injected a mixture containing 1 ml of 2%  plain lidocaine, 1 ml 0.5% plain marcaine, 0.5 ml of kenalog 10 and 0.5 ml of dexamethasone phosphate into left heel.  Post-injection care discussed with patient.  -Prescribed prednisone for patient to take as instructed and advised patient if she still has pain may also use Tylenol -Recommended good supportive shoes and advised use of left fascial brace which was dispensed at today's visit. -Explained and dispensed to patient daily stretching exercises. -Recommend patient to ice affected area 1-2x daily. -Patient to return to office in 4 weeks for follow up or sooner if problems or questions arise.  Asencion Islam, DPM

## 2021-01-06 ENCOUNTER — Ambulatory Visit (INDEPENDENT_AMBULATORY_CARE_PROVIDER_SITE_OTHER): Payer: Medicare Other | Admitting: Sports Medicine

## 2021-01-06 ENCOUNTER — Other Ambulatory Visit: Payer: Self-pay

## 2021-01-06 DIAGNOSIS — M779 Enthesopathy, unspecified: Secondary | ICD-10-CM

## 2021-01-06 DIAGNOSIS — M25572 Pain in left ankle and joints of left foot: Secondary | ICD-10-CM

## 2021-01-06 DIAGNOSIS — M2141 Flat foot [pes planus] (acquired), right foot: Secondary | ICD-10-CM

## 2021-01-06 DIAGNOSIS — M722 Plantar fascial fibromatosis: Secondary | ICD-10-CM | POA: Diagnosis not present

## 2021-01-06 DIAGNOSIS — M2142 Flat foot [pes planus] (acquired), left foot: Secondary | ICD-10-CM

## 2021-01-06 MED ORDER — TRIAMCINOLONE ACETONIDE 10 MG/ML IJ SUSP
10.0000 mg | Freq: Once | INTRAMUSCULAR | Status: AC
  Administered 2021-01-06: 10 mg

## 2021-01-06 NOTE — Progress Notes (Signed)
Subjective: Molly Carroll is a 65 y.o. female patient returns office for follow-up evaluation of left heel pain.  Patient reports that pain is some better 4 out of 10 at worst has been using brace and has completed prednisone which seemed to help.  Patient reports that she no longer has pain with first few steps or into her ankle but still has pain at her heel.  Patient Active Problem List   Diagnosis Date Noted  . Sinusitis 09/21/2020  . Other specified hypothyroidism   . Mixed hyperlipidemia     Current Outpatient Medications on File Prior to Visit  Medication Sig Dispense Refill  . levothyroxine (SYNTHROID) 100 MCG tablet Take 1 tablet (100 mcg total) by mouth daily before breakfast. 90 tablet 2   No current facility-administered medications on file prior to visit.    No Known Allergies  Objective: Physical Exam General: The patient is alert and oriented x3 in no acute distress.  Dermatology: Skin is warm, dry and supple bilateral lower extremities. Nails 1-10 are normal. There is no erythema, focal edema left foot and ankle, no eccymosis, no open lesions present. Integument is otherwise unremarkable.  Vascular: Dorsalis Pedis pulse and Posterior Tibial pulse are 2/4 bilateral. Capillary fill time is immediate to all digits.  Neurological: Grossly intact to light touch bilateral.  Musculoskeletal: Tenderness to palpation at the medial calcaneal tubercale and through the insertion of the plantar fascia on the left foot.  There is no reproducible pain along the posterior tibial tendon course of the left foot.  No pain with compression of calcaneus bilateral. All other joints range of motion within normal limits bilateral. Strength 5/5 in all groups bilateral.  Pes planus foot type.  Assessment and Plan: Problem List Items Addressed This Visit   None   Visit Diagnoses    Plantar fasciitis of left foot    -  Primary   Tendonitis       Acute left ankle pain       Pes planus of  both feet          -Complete examination performed.  -Re-Discussed with patient in detail the condition of plantar fasciitis with resolved tendinitis, how this occurs and general treatment options. Explained both conservative and surgical treatments.  -After oral consent and aseptic prep, injected a mixture containing 1 ml of 2%  plain lidocaine, 1 ml 0.5% plain marcaine, 0.5 ml of kenalog 10 and 0.5 ml of dexamethasone phosphate into left heel.  The primary source of pain.  This is injection #2 to the area.  Post-injection care discussed with patient.  -Recommended good supportive shoes and continue with use of left plantar fascial brace for 1 month and then may slowly wean as directed -Advised patient to return to using the night splint 1 hour before going to bed -Re-Explained daily stretching exercises. -Recommend patient to ice affected area 1-2x daily.  Ice pack provided. -Shoe list provided -Patient to return to office in 4 weeks for follow up or sooner if problems or questions arise.  Asencion Islam, DPM

## 2021-01-06 NOTE — Patient Instructions (Signed)
Continue with your plantar fascial brace for 1 month thenafter may slowly wean from using Go back to using your night splint 1 hour before bed Continue stretching as instructed Recommend icing in the evenings for  For tennis shoes recommend:  [employer] New balance Saucony Can be purchased at [employer] or [employer] arch fit Can be purchased at any major retailers  Vionic  SAS Can be purchased at [employer] or [employer]   For work shoes recommend: Pharmacologist Work [employer] Can be purchased at a variety of places or Scientist, product/process development   For casual shoes recommend: Vionic  Can be purchased at [employer] or [employer]   For Over the [employer] recommend: Power Steps Can be purchased in office/[facility] [employer] Can be purchased at [employer] or [employer] Can be purchased at [employer]

## 2021-02-03 ENCOUNTER — Ambulatory Visit (INDEPENDENT_AMBULATORY_CARE_PROVIDER_SITE_OTHER): Payer: Medicare Other | Admitting: Sports Medicine

## 2021-02-03 ENCOUNTER — Encounter: Payer: Self-pay | Admitting: Sports Medicine

## 2021-02-03 ENCOUNTER — Other Ambulatory Visit: Payer: Self-pay

## 2021-02-03 DIAGNOSIS — M2141 Flat foot [pes planus] (acquired), right foot: Secondary | ICD-10-CM

## 2021-02-03 DIAGNOSIS — M722 Plantar fascial fibromatosis: Secondary | ICD-10-CM

## 2021-02-03 DIAGNOSIS — M779 Enthesopathy, unspecified: Secondary | ICD-10-CM

## 2021-02-03 DIAGNOSIS — M25572 Pain in left ankle and joints of left foot: Secondary | ICD-10-CM

## 2021-02-03 DIAGNOSIS — M2142 Flat foot [pes planus] (acquired), left foot: Secondary | ICD-10-CM

## 2021-02-03 DIAGNOSIS — M79672 Pain in left foot: Secondary | ICD-10-CM

## 2021-02-03 MED ORDER — TRIAMCINOLONE ACETONIDE 10 MG/ML IJ SUSP
10.0000 mg | Freq: Once | INTRAMUSCULAR | Status: AC
  Administered 2021-02-03: 10 mg

## 2021-02-03 NOTE — Progress Notes (Signed)
Subjective: Molly Carroll is a 65 y.o. female patient returns office for follow-up evaluation of left heel pain.  Patient reports that pain is some better 2 out of 10 at worst after she has been on her feet throughout the day.  Patient states that her plantar fascial brace seems to help as well but did have an issue where it rubs when she was in a sandal so she had to start wearing a sock with the brace.  Patient denies any other pedal complaints at this time.  Patient Active Problem List   Diagnosis Date Noted  . Sinusitis 09/21/2020  . Other specified hypothyroidism   . Mixed hyperlipidemia     Current Outpatient Medications on File Prior to Visit  Medication Sig Dispense Refill  . levothyroxine (SYNTHROID) 100 MCG tablet Take 1 tablet (100 mcg total) by mouth daily before breakfast. 90 tablet 2   No current facility-administered medications on file prior to visit.    No Known Allergies  Objective: Physical Exam General: The patient is alert and oriented x3 in no acute distress.  Dermatology: Skin is warm, dry and supple bilateral lower extremities. Nails 1-10 are normal. There is no erythema, no edema, no eccymosis, no open lesions present. Integument is otherwise unremarkable.  Vascular: Dorsalis Pedis pulse and Posterior Tibial pulse are 2/4 bilateral. Capillary fill time is immediate to all digits.  Neurological: Grossly intact to light touch bilateral.  Musculoskeletal: Tenderness to palpation at the medial calcaneal tubercale and through the insertion of the plantar fascia on the left foot.  There is no reproducible pain along the posterior tibial tendon course of the left foot or ankle.  No pain with compression of calcaneus bilateral. All other joints range of motion within normal limits bilateral. Strength 5/5 in all groups bilateral.  Pes planus foot type.  Assessment and Plan: Problem List Items Addressed This Visit   None   Visit Diagnoses    Plantar fasciitis of  left foot    -  Primary   Relevant Medications   triamcinolone acetonide (KENALOG) 10 MG/ML injection 10 mg (Completed) (Start on 02/03/2021  1:00 PM)   Tendonitis       Acute left ankle pain       Pes planus of both feet       Foot pain, left          -Complete examination performed.  -Re-Discussed with patient in detail the condition of plantar fasciitis with resolved tendinitis, how this occurs and general treatment options. Explained both conservative and surgical treatments.  -After oral consent and aseptic prep, injected a mixture containing 1 ml of 2%  plain lidocaine, 1 ml 0.5% plain marcaine, 0.5 ml of kenalog 10 and 0.5 ml of dexamethasone phosphate into left heel.  The primary source of pain.  This is injection #3 to the area.  Post-injection care discussed with patient.  -Recommended good supportive shoes and continue with weaning from plantar fascial brace as tolerated -Advised patient that even if symptoms have resolved to continue with stretching to prevent reoccurrence -Continue with good supportive shoes daily for foot type -Patient to return to office if symptoms fail to continue to improve or sooner if problems or questions arise.  Asencion Islam, DPM

## 2021-03-01 LAB — HM MAMMOGRAPHY

## 2021-03-23 ENCOUNTER — Encounter: Payer: Self-pay | Admitting: Legal Medicine

## 2021-03-30 ENCOUNTER — Encounter: Payer: Self-pay | Admitting: Legal Medicine

## 2021-03-30 ENCOUNTER — Other Ambulatory Visit: Payer: Self-pay

## 2021-03-30 ENCOUNTER — Ambulatory Visit (INDEPENDENT_AMBULATORY_CARE_PROVIDER_SITE_OTHER): Payer: Medicare Other | Admitting: Legal Medicine

## 2021-03-30 VITALS — BP 114/70 | HR 70 | Temp 97.5°F | Ht 62.0 in | Wt 145.4 lb

## 2021-03-30 DIAGNOSIS — E782 Mixed hyperlipidemia: Secondary | ICD-10-CM | POA: Diagnosis not present

## 2021-03-30 DIAGNOSIS — E038 Other specified hypothyroidism: Secondary | ICD-10-CM

## 2021-03-30 NOTE — Progress Notes (Signed)
Established Patient Office Visit  Subjective:  Patient ID: [name], female    DOB: 1956-07-28  Age: 65 y.o. MRN: [id number]  CC:  Chief Complaint  Patient presents with   Hyperlipidemia   Hypertension    HPI Molly Carroll presents for chronic visit  Patient presents for follow up of hypertension.  Patient tolerating none well with side effects.  Patient was diagnosed with hypertension 2010 so has been treated for hypertension for 10 years.Patient is working on maintaining diet and exercise regimen and follows up as directed. Complication include none  Patient has HYPOTHYROIDISM.  Diagnosed 10 years ago.  Patient has stable thyroid readings.  Patient is having no symptom.  Last TSH was normalcontinue dosage of thyroid medicine. .   Past Medical History:  Diagnosis Date   COVID-19 05/31/2019   Mixed hyperlipidemia    Other specified hypothyroidism     Past Surgical History:  Procedure Laterality Date   CHOLECYSTECTOMY  1991   HYSTERECTOMY ABDOMINAL WITH SALPINGECTOMY  1993   KNEE SURGERY Left 2005    Family History  Problem Relation Age of Onset   Aortic aneurysm Mother    Prostate cancer Father     Social History   Socioeconomic History   Marital status: Married    Spouse name: Not on file   Number of children: 2   Years of education: Not on file   Highest education level: Not on file  Occupational History   Occupation: Housewife  Tobacco Use   Smoking status: Former    Types: Cigarettes    Quit date: 2007    Years since quitting: 15.5   Smokeless tobacco: Never  Substance and Sexual Activity   Alcohol use: Not Currently   Drug use: Never   Sexual activity: Yes    Partners: Male  Other Topics Concern   Not on file  Social History Narrative   Not on file   Social Determinants of Health   Financial Resource Strain: Not on file  Food Insecurity: Not on file  Transportation Needs: Not on file  Physical Activity: Not on file  Stress: Not on  file  Social Connections: Not on file  Intimate Partner Violence: Not on file    Outpatient Medications Prior to Visit  Medication Sig Dispense Refill   levothyroxine (SYNTHROID) 100 MCG tablet Take 1 tablet (100 mcg total) by mouth daily before breakfast. 90 tablet 2   No facility-administered medications prior to visit.    No Known Allergies  ROS Review of Systems  Constitutional:  Negative for activity change and appetite change.  Eyes:  Negative for visual disturbance.  Respiratory:  Negative for chest tightness and shortness of breath.   Cardiovascular:  Negative for chest pain, palpitations and leg swelling.  Gastrointestinal:  Negative for abdominal distention and abdominal pain.  Endocrine: Negative for polyuria.  Genitourinary:  Negative for difficulty urinating and dysuria.  Musculoskeletal:  Negative for arthralgias and back pain.  Neurological: Negative.   Psychiatric/Behavioral: Negative.    All other systems reviewed and are negative.    Objective:    Physical Exam Vitals reviewed.  Constitutional:      Appearance: Normal appearance.  HENT:     Head: Normocephalic and atraumatic.     Right Ear: Tympanic membrane, ear canal and external ear normal.     Left Ear: Tympanic membrane, ear canal and external ear normal.     Mouth/Throat:     Mouth: Mucous membranes are moist.  Pharynx: Oropharynx is clear.  Eyes:     Conjunctiva/sclera: Conjunctivae normal.     Pupils: Pupils are equal, round, and reactive to light.  Cardiovascular:     Rate and Rhythm: Normal rate and regular rhythm.     Pulses: Normal pulses.     Heart sounds: Normal heart sounds. No murmur heard.   No gallop.  Pulmonary:     Effort: Pulmonary effort is normal. No respiratory distress.     Breath sounds: No wheezing.  Abdominal:     General: Abdomen is flat. Bowel sounds are normal. There is no distension.     Palpations: Abdomen is soft.     Tenderness: There is no abdominal  tenderness.  Musculoskeletal:        General: Normal range of motion.     Cervical back: Normal range of motion and neck supple.  Skin:    General: Skin is warm.     Capillary Refill: Capillary refill takes less than 2 seconds.  Neurological:     General: No focal deficit present.     Mental Status: She is alert and oriented to person, place, and time. Mental status is at baseline.  Psychiatric:        Mood and Affect: Mood normal.    BP 114/70   Pulse 70   Temp (!) 97.5 F (36.4 C)   Ht 5\' 2"  (1.575 m)   Wt 145 lb 6.4 oz (66 kg)   SpO2 98%   BMI 26.59 kg/m  Wt Readings from Last 3 Encounters:  03/30/21 145 lb 6.4 oz (66 kg)  09/30/20 156 lb 12.8 oz (71.1 kg)  09/21/20 152 lb (68.9 kg)     Health Maintenance Due  Topic Date Due   COVID-19 Vaccine (1) Never done   HIV Screening  Never done   Hepatitis C Screening  Never done   TETANUS/TDAP  Never done   Zoster Vaccines- Shingrix (1 of 2) Never done   DEXA SCAN  Never done   PNA vac Low Risk Adult (1 of 2 - PCV13) Never done    There are no preventive care reminders to display for this patient.  Lab Results  Component Value Date   TSH 1.730 09/30/2020   Lab Results  Component Value Date   WBC 8.4 09/30/2020   HGB 13.3 09/30/2020   HCT 40.4 09/30/2020   MCV 88 09/30/2020   PLT 359 09/30/2020   Lab Results  Component Value Date   NA 140 09/30/2020   K 4.8 09/30/2020   CO2 26 09/30/2020   GLUCOSE 87 09/30/2020   BUN 15 09/30/2020   CREATININE 0.97 09/30/2020   BILITOT 0.3 09/30/2020   ALKPHOS 79 09/30/2020   AST 24 09/30/2020   ALT 18 09/30/2020   PROT 7.3 09/30/2020   ALBUMIN 4.3 09/30/2020   CALCIUM 9.4 09/30/2020   Lab Results  Component Value Date   CHOL 210 (H) 09/30/2020   Lab Results  Component Value Date   HDL 50 09/30/2020   Lab Results  Component Value Date   LDLCALC 133 (H) 09/30/2020   Lab Results  Component Value Date   TRIG 150 (H) 09/30/2020   Lab Results  Component  Value Date   CHOLHDL 4.2 09/30/2020   No results found for: HGBA1C    Assessment & Plan:   Problem List Items Addressed This Visit       Endocrine   Other specified hypothyroidism   Relevant Orders   CBC with  Differential   Comprehensive metabolic panel   TSH Patient is known to have hypothyroidism and is n treatment with levothroid .  Patient was diagnosed 10 years ago.  Other treatment includes none.  Patient is compliant with medicines and last TSH 6 months ago.  Last TSH was normal.      Other   Mixed hyperlipidemia - Primary   Relevant Orders   CBC with Differential   Comprehensive metabolic panel   Lipid panel AN INDIVIDUAL CARE PLAN for hyperlipidemia/ cholesterol was established and reinforced today.  The patient's status was assessed using clinical findings on exam, lab and other diagnostic tests. The patient's disease status was assessed based on evidence-based guidelines and found to be fair controlled. MEDICATIONS were reviewed. SELF MANAGEMENT GOALS have been discussed and patient's success at attaining the goal of low cholesterol was assessed. RECOMMENDATION given include regular exercise 3 days a week and low cholesterol/low fat diet. CLINICAL SUMMARY including written plan to identify barriers unique to the patient due to social or economic  reasons was discussed.        Follow-up: Return in about 6 months (around 09/30/2021) for fasting.    Brent Bulla, MD

## 2021-03-31 ENCOUNTER — Other Ambulatory Visit: Payer: Self-pay

## 2021-03-31 DIAGNOSIS — E875 Hyperkalemia: Secondary | ICD-10-CM

## 2021-03-31 LAB — COMPREHENSIVE METABOLIC PANEL
ALT: 14 IU/L (ref 0–32)
AST: 23 IU/L (ref 0–40)
Albumin/Globulin Ratio: 1.5 (ref 1.2–2.2)
Albumin: 4.4 g/dL (ref 3.8–4.8)
Alkaline Phosphatase: 72 IU/L (ref 44–121)
BUN/Creatinine Ratio: 16 (ref 12–28)
BUN: 13 mg/dL (ref 8–27)
Bilirubin Total: 0.4 mg/dL (ref 0.0–1.2)
CO2: 24 mmol/L (ref 20–29)
Calcium: 9.6 mg/dL (ref 8.7–10.3)
Chloride: 103 mmol/L (ref 96–106)
Creatinine, Ser: 0.82 mg/dL (ref 0.57–1.00)
Globulin, Total: 2.9 g/dL (ref 1.5–4.5)
Glucose: 74 mg/dL (ref 65–99)
Potassium: 5.3 mmol/L — ABNORMAL HIGH (ref 3.5–5.2)
Sodium: 142 mmol/L (ref 134–144)
Total Protein: 7.3 g/dL (ref 6.0–8.5)
eGFR: 79 mL/min/{1.73_m2} (ref >59)

## 2021-03-31 LAB — CBC WITH DIFFERENTIAL/PLATELET
Basophils Absolute: 0 10*3/uL (ref 0.0–0.2)
Basos: 1 %
EOS (ABSOLUTE): 0.1 10*3/uL (ref 0.0–0.4)
Eos: 1 %
Hematocrit: 41.5 % (ref 34.0–46.6)
Hemoglobin: 13.7 g/dL (ref 11.1–15.9)
Immature Grans (Abs): 0 10*3/uL (ref 0.0–0.1)
Immature Granulocytes: 0 %
Lymphocytes Absolute: 2.4 10*3/uL (ref 0.7–3.1)
Lymphs: 38 %
MCH: 29.7 pg (ref 26.6–33.0)
MCHC: 33 g/dL (ref 31.5–35.7)
MCV: 90 fL (ref 79–97)
Monocytes Absolute: 0.7 10*3/uL (ref 0.1–0.9)
Monocytes: 10 %
Neutrophils Absolute: 3.2 10*3/uL (ref 1.4–7.0)
Neutrophils: 50 %
Platelets: 302 10*3/uL (ref 150–450)
RBC: 4.62 x10E6/uL (ref 3.77–5.28)
RDW: 12.5 % (ref 11.7–15.4)
WBC: 6.4 10*3/uL (ref 3.4–10.8)

## 2021-03-31 LAB — TSH: TSH: 0.42 u[IU]/mL — ABNORMAL LOW (ref 0.450–4.500)

## 2021-03-31 LAB — LIPID PANEL
Chol/HDL Ratio: 4.5 ratio — ABNORMAL HIGH (ref 0.0–4.4)
Cholesterol, Total: 189 mg/dL (ref 100–199)
HDL: 42 mg/dL (ref >39)
LDL Chol Calc (NIH): 123 mg/dL — ABNORMAL HIGH (ref 0–99)
Triglycerides: 131 mg/dL (ref 0–149)
VLDL Cholesterol Cal: 24 mg/dL (ref 5–40)

## 2021-03-31 LAB — CARDIOVASCULAR RISK ASSESSMENT

## 2021-03-31 NOTE — Progress Notes (Signed)
CBC normal, kidney tests normal potassium high 5.3 recheck one week, Liver tests normal, LDLc cholesterol 123 high consider statin, TSH 0.42 low watch for now lp

## 2021-04-06 ENCOUNTER — Other Ambulatory Visit: Payer: Self-pay

## 2021-04-06 ENCOUNTER — Other Ambulatory Visit: Payer: Medicare Other

## 2021-04-06 DIAGNOSIS — E875 Hyperkalemia: Secondary | ICD-10-CM

## 2021-04-06 LAB — COMPREHENSIVE METABOLIC PANEL
ALT: 16 IU/L (ref 0–32)
AST: 23 IU/L (ref 0–40)
Albumin/Globulin Ratio: 1.8 (ref 1.2–2.2)
Albumin: 4.3 g/dL (ref 3.8–4.8)
Alkaline Phosphatase: 72 IU/L (ref 44–121)
BUN/Creatinine Ratio: 16 (ref 12–28)
BUN: 14 mg/dL (ref 8–27)
Bilirubin Total: 0.3 mg/dL (ref 0.0–1.2)
CO2: 25 mmol/L (ref 20–29)
Calcium: 9.1 mg/dL (ref 8.7–10.3)
Chloride: 104 mmol/L (ref 96–106)
Creatinine, Ser: 0.86 mg/dL (ref 0.57–1.00)
Globulin, Total: 2.4 g/dL (ref 1.5–4.5)
Glucose: 103 mg/dL — ABNORMAL HIGH (ref 65–99)
Potassium: 4.3 mmol/L (ref 3.5–5.2)
Sodium: 142 mmol/L (ref 134–144)
Total Protein: 6.7 g/dL (ref 6.0–8.5)
eGFR: 75 mL/min/{1.73_m2} (ref >59)

## 2021-04-07 NOTE — Progress Notes (Signed)
Glucose 103, kidney tests normal potassium 4.3 normal, liver tests normal,  lp

## 2021-06-20 ENCOUNTER — Other Ambulatory Visit: Payer: Self-pay | Admitting: Legal Medicine

## 2021-06-20 DIAGNOSIS — E038 Other specified hypothyroidism: Secondary | ICD-10-CM

## 2021-06-21 ENCOUNTER — Other Ambulatory Visit: Payer: Self-pay | Admitting: Legal Medicine

## 2021-06-21 DIAGNOSIS — E038 Other specified hypothyroidism: Secondary | ICD-10-CM

## 2021-10-05 ENCOUNTER — Encounter: Payer: Self-pay | Admitting: Legal Medicine

## 2021-10-05 ENCOUNTER — Ambulatory Visit (INDEPENDENT_AMBULATORY_CARE_PROVIDER_SITE_OTHER): Payer: Medicare Other | Admitting: Legal Medicine

## 2021-10-05 ENCOUNTER — Other Ambulatory Visit: Payer: Self-pay

## 2021-10-05 VITALS — BP 110/64 | HR 74 | Temp 98.0°F | Resp 14 | Ht 62.0 in | Wt 148.0 lb

## 2021-10-05 DIAGNOSIS — Z6827 Body mass index (BMI) 27.0-27.9, adult: Secondary | ICD-10-CM | POA: Diagnosis not present

## 2021-10-05 DIAGNOSIS — E038 Other specified hypothyroidism: Secondary | ICD-10-CM | POA: Diagnosis not present

## 2021-10-05 DIAGNOSIS — E782 Mixed hyperlipidemia: Secondary | ICD-10-CM

## 2021-10-05 DIAGNOSIS — Z6826 Body mass index (BMI) 26.0-26.9, adult: Secondary | ICD-10-CM | POA: Insufficient documentation

## 2021-10-05 NOTE — Progress Notes (Signed)
Subjective:  Patient ID: Molly BlackerRhonda H Vidrine, female    DOB: [birth year]  Age: 66 y.o. MRN: [id number]  Chief Complaint  Patient presents with   Hypothyroidism    HPI Hypothyroidism: She is taking levothyroxine 100 mcg daily. She denied any hair loss,  Hyperlipidemia: Patient is not taking any medication, but she is eating healthy.   Current Outpatient Medications on File Prior to Visit  Medication Sig Dispense Refill   SYNTHROID 100 MCG tablet TAKE 1 TABLET BY MOUTH ONCE DAILY BEFORE BREAKFAST 180 tablet 0   No current facility-administered medications on file prior to visit.   Past Medical History:  Diagnosis Date   COVID-19 05/31/2019   Mixed hyperlipidemia    Other specified hypothyroidism    Past Surgical History:  Procedure Laterality Date   CHOLECYSTECTOMY  1991   HYSTERECTOMY ABDOMINAL WITH SALPINGECTOMY  1993   KNEE SURGERY Left 2005    Family History  Problem Relation Age of Onset   Aortic aneurysm Mother    Prostate cancer Father    Social History   Socioeconomic History   Marital status: Married    Spouse name: Not on file   Number of children: 2   Years of education: Not on file   Highest education level: Not on file  Occupational History   Occupation: Housewife  Tobacco Use   Smoking status: Former    Types: Cigarettes    Quit date: 2007    Years since quitting: 16.0   Smokeless tobacco: Never  Substance and Sexual Activity   Alcohol use: Not Currently   Drug use: Never   Sexual activity: Yes    Partners: Male  Other Topics Concern   Not on file  Social History Narrative   Not on file   Social Determinants of Health   Financial Resource Strain: Not on file  Food Insecurity: Not on file  Transportation Needs: Not on file  Physical Activity: Not on file  Stress: Not on file  Social Connections: Not on file    Review of Systems  Constitutional:  Negative for chills, fatigue and fever.  HENT:  Negative for congestion, ear pain and sore  throat.   Respiratory:  Negative for cough and shortness of breath.   Cardiovascular:  Negative for chest pain and palpitations.  Gastrointestinal:  Negative for abdominal pain, constipation, diarrhea, nausea and vomiting.  Endocrine: Negative for polydipsia, polyphagia and polyuria.  Genitourinary:  Negative for difficulty urinating and dysuria.  Musculoskeletal:  Negative for arthralgias, back pain and myalgias.  Skin:  Negative for rash.  Neurological:  Negative for headaches.  Psychiatric/Behavioral:  Negative for dysphoric mood. The patient is not nervous/anxious.     Objective:  BP 110/64    Pulse 74    Temp 98 F (36.7 C)    Resp 14    Ht 5\' 2"  (1.575 m)    Wt 148 lb (67.1 kg)    SpO2 97%    BMI 27.07 kg/m   BP/Weight 10/05/2021 03/30/2021 09/30/2020  Systolic BP 110 114 110  Diastolic BP 64 70 68  Wt. (Lbs) 148 145.4 156.8  BMI 27.07 26.59 28.68    Physical Exam Constitutional:      Appearance: Normal appearance. She is normal weight.  HENT:     Head: Normocephalic.     Right Ear: Tympanic membrane, ear canal and external ear normal.     Left Ear: Tympanic membrane, ear canal and external ear normal.     Nose: Nose normal.  Mouth/Throat:     Mouth: Mucous membranes are moist.     Pharynx: Oropharynx is clear. No posterior oropharyngeal erythema.  Eyes:     Extraocular Movements: Extraocular movements intact.     Conjunctiva/sclera: Conjunctivae normal.     Pupils: Pupils are equal, round, and reactive to light.  Neck:     Vascular: No carotid bruit.     Comments: Thyroid is normal size. Cardiovascular:     Rate and Rhythm: Normal rate and regular rhythm.     Pulses: Normal pulses.     Heart sounds: Normal heart sounds. No murmur heard. Pulmonary:     Effort: Pulmonary effort is normal.     Breath sounds: Normal breath sounds.  Abdominal:     General: Bowel sounds are normal.     Palpations: Abdomen is soft. There is no mass.  Musculoskeletal:         General: Normal range of motion.     Cervical back: Normal range of motion. No tenderness.     Right lower leg: No edema.     Left lower leg: No edema.  Skin:    Capillary Refill: Capillary refill takes 2 to 3 seconds.  Neurological:     Gait: Gait normal.     Deep Tendon Reflexes: Reflexes normal.  Psychiatric:        Mood and Affect: Mood normal.        Behavior: Behavior normal.        Thought Content: Thought content normal.        Lab Results  Component Value Date   WBC 6.4 03/30/2021   HGB 13.7 03/30/2021   HCT 41.5 03/30/2021   PLT 302 03/30/2021   GLUCOSE 103 (H) 04/06/2021   CHOL 189 03/30/2021   TRIG 131 03/30/2021   HDL 42 03/30/2021   LDLCALC 123 (H) 03/30/2021   ALT 16 04/06/2021   AST 23 04/06/2021   NA 142 04/06/2021   K 4.3 04/06/2021   CL 104 04/06/2021   CREATININE 0.86 04/06/2021   BUN 14 04/06/2021   CO2 25 04/06/2021   TSH 0.420 (L) 03/30/2021      Assessment & Plan:   Problem List Items Addressed This Visit       Endocrine   Other specified hypothyroidism - Primary   Relevant Orders   TSH Patient is known to have hypothyroid and is n treatment with levothyroxine .  Patient was diagnosed 20 years ago.  Other treatment includes none.  Patient is compliant with medicines and last TSH 6 months ago.  Last TSH was normal.      Other   Mixed hyperlipidemia   Relevant Orders   Comprehensive metabolic panel   Lipid panel AN INDIVIDUAL CARE PLAN for hyperlipidemia/ cholesterol was established and reinforced today.  The patient's status was assessed using clinical findings on exam, lab and other diagnostic tests. The patient's disease status was assessed based on evidence-based guidelines and found to be fair controlled. MEDICATIONS were reviewed. SELF MANAGEMENT GOALS have been discussed and patient's success at attaining the goal of low cholesterol was assessed. RECOMMENDATION given include regular exercise 3 days a week and low  cholesterol/low fat diet. CLINICAL SUMMARY including written plan to identify barriers unique to the patient due to social or economic  reasons was discussed.    BMI 27.0-27.9,adult Continue diet and exercise  .    Orders Placed This Encounter  Procedures   Comprehensive metabolic panel   Lipid panel   TSH  Follow-up: Return in about 6 months (around 04/04/2022) for fasting.  An After Visit Summary was printed and given to the patient.  Brent Bulla, MD [facility] [phone]

## 2021-10-06 LAB — COMPREHENSIVE METABOLIC PANEL
ALT: 17 IU/L (ref 0–32)
AST: 25 IU/L (ref 0–40)
Albumin/Globulin Ratio: 1.9 (ref 1.2–2.2)
Albumin: 4.6 g/dL (ref 3.8–4.8)
Alkaline Phosphatase: 75 IU/L (ref 44–121)
BUN/Creatinine Ratio: 17 (ref 12–28)
BUN: 15 mg/dL (ref 8–27)
Bilirubin Total: 0.4 mg/dL (ref 0.0–1.2)
CO2: 25 mmol/L (ref 20–29)
Calcium: 9.5 mg/dL (ref 8.7–10.3)
Chloride: 101 mmol/L (ref 96–106)
Creatinine, Ser: 0.89 mg/dL (ref 0.57–1.00)
Globulin, Total: 2.4 g/dL (ref 1.5–4.5)
Glucose: 66 mg/dL — ABNORMAL LOW (ref 70–99)
Potassium: 4.5 mmol/L (ref 3.5–5.2)
Sodium: 139 mmol/L (ref 134–144)
Total Protein: 7 g/dL (ref 6.0–8.5)
eGFR: 72 mL/min/{1.73_m2} (ref >59)

## 2021-10-06 LAB — LIPID PANEL
Chol/HDL Ratio: 4.6 ratio — ABNORMAL HIGH (ref 0.0–4.4)
Cholesterol, Total: 234 mg/dL — ABNORMAL HIGH (ref 100–199)
HDL: 51 mg/dL (ref >39)
LDL Chol Calc (NIH): 159 mg/dL — ABNORMAL HIGH (ref 0–99)
Triglycerides: 134 mg/dL (ref 0–149)
VLDL Cholesterol Cal: 24 mg/dL (ref 5–40)

## 2021-10-06 LAB — CARDIOVASCULAR RISK ASSESSMENT

## 2021-10-06 LAB — TSH: TSH: 0.476 u[IU]/mL (ref 0.450–4.500)

## 2021-10-06 NOTE — Progress Notes (Signed)
LDL cholesterol High ( bad cholesterol) 159 still consider medical management, glucose 66, kidney and liver tests normal, TSH 0.476 normal,  lp

## 2021-12-18 ENCOUNTER — Other Ambulatory Visit: Payer: Self-pay | Admitting: Legal Medicine

## 2021-12-18 ENCOUNTER — Other Ambulatory Visit: Payer: Self-pay

## 2021-12-18 DIAGNOSIS — E038 Other specified hypothyroidism: Secondary | ICD-10-CM

## 2021-12-18 MED ORDER — SYNTHROID 100 MCG PO TABS: 100.0000 ug | ORAL_TABLET | Freq: Every day | ORAL | 0 refills | Status: DC

## 2021-12-18 MED ORDER — SYNTHROID 100 MCG PO TABS: 100.0000 ug | ORAL_TABLET | Freq: Every day | ORAL | 2 refills | Status: DC

## 2021-12-20 ENCOUNTER — Other Ambulatory Visit: Payer: Self-pay

## 2021-12-20 DIAGNOSIS — E038 Other specified hypothyroidism: Secondary | ICD-10-CM

## 2021-12-20 NOTE — Telephone Encounter (Signed)
Patient called stating that she needs a refill on her synthroid, I stated we sent it on the 17 th with 2 refills and she states she is going to call them back.  ?

## 2022-03-08 LAB — HM MAMMOGRAPHY

## 2022-03-27 ENCOUNTER — Encounter: Payer: Medicare Other | Admitting: Family Medicine

## 2022-04-03 NOTE — Progress Notes (Signed)
Subjective:  Patient ID: Molly Carroll, female    DOB: Sep 19, 1955  Age: 66 y.o. MRN: [id number]  Chief Complaint  Patient presents with   Hypothyroidism   Hyperlipidemia    HPI: chronic Hypothyroidism: Patient is taking Levothyroxine 100 mcg daily.  Patient presents with hyperlipidemia.  Compliance with treatment has been good; patient takes medicines as directed, maintains low cholesterol diet, follows up as directed, and maintains exercise regimen.  Patient is using none without problems.    No current outpatient medications on file prior to visit.   No current facility-administered medications on file prior to visit.   Past Medical History:  Diagnosis Date   COVID-19 05/31/2019   Mixed hyperlipidemia    Other specified hypothyroidism    Past Surgical History:  Procedure Laterality Date   CHOLECYSTECTOMY  1991   HYSTERECTOMY ABDOMINAL WITH SALPINGECTOMY  1993   KNEE SURGERY Left 2005    Family History  Problem Relation Age of Onset   Aortic aneurysm Mother    Prostate cancer Father    Social History   Socioeconomic History   Marital status: Married    Spouse name: Not on file   Number of children: 2   Years of education: Not on file   Highest education level: Not on file  Occupational History   Occupation: Housewife  Tobacco Use   Smoking status: Former    Types: Cigarettes    Quit date: 2007    Years since quitting: 16.6   Smokeless tobacco: Never  Substance and Sexual Activity   Alcohol use: Not Currently   Drug use: Never   Sexual activity: Yes    Partners: Male  Other Topics Concern   Not on file  Social History Narrative   Not on file   Social Determinants of Health   Financial Resource Strain: Not on file  Food Insecurity: Not on file  Transportation Needs: Not on file  Physical Activity: Not on file  Stress: Not on file  Social Connections: Not on file    Review of Systems  Constitutional:  Negative for chills, fatigue and fever.   HENT:  Negative for congestion, ear pain and sore throat.   Eyes:  Negative for visual disturbance.  Respiratory:  Negative for cough and shortness of breath.   Cardiovascular:  Negative for chest pain and palpitations.  Gastrointestinal:  Negative for abdominal pain, constipation, diarrhea, nausea and vomiting.  Endocrine: Negative for polydipsia, polyphagia and polyuria.  Genitourinary:  Negative for difficulty urinating and dysuria.  Musculoskeletal:  Negative for arthralgias, back pain and myalgias.  Skin:  Negative for rash.  Neurological:  Negative for headaches.  Psychiatric/Behavioral:  Negative for dysphoric mood. The patient is not nervous/anxious.      Objective:  BP 110/60   Pulse 78   Temp 98.6 F (37 C)   Resp 15   Ht 5\' 2"  (1.575 m)   Wt 146 lb (66.2 kg)   SpO2 96%   BMI 26.70 kg/m      04/04/2022   10:19 AM 10/05/2021   10:05 AM 03/30/2021    8:46 AM  BP/Weight  Systolic BP 110 110 114  Diastolic BP 60 64 70  Wt. (Lbs) 146 148 145.4  BMI 26.7 kg/m2 27.07 kg/m2 26.59 kg/m2    Physical Exam Vitals reviewed.  Constitutional:      General: She is not in acute distress.    Appearance: Normal appearance.  HENT:     Right Ear: Tympanic membrane normal.  Left Ear: Tympanic membrane normal.     Nose: Nose normal.     Mouth/Throat:     Mouth: Mucous membranes are moist.     Pharynx: Oropharynx is clear.  Eyes:     Extraocular Movements: Extraocular movements intact.     Conjunctiva/sclera: Conjunctivae normal.     Pupils: Pupils are equal, round, and reactive to light.  Cardiovascular:     Rate and Rhythm: Normal rate and regular rhythm.     Pulses: Normal pulses.     Heart sounds: Normal heart sounds. No murmur heard.    No gallop.  Pulmonary:     Effort: Pulmonary effort is normal. No respiratory distress.     Breath sounds: Normal breath sounds. No wheezing.  Abdominal:     General: Abdomen is flat. Bowel sounds are normal. There is no  distension.     Palpations: Abdomen is soft.     Tenderness: There is no abdominal tenderness.  Musculoskeletal:        General: Normal range of motion.     Cervical back: Normal range of motion and neck supple.     Right lower leg: No edema.     Left lower leg: No edema.  Skin:    General: Skin is warm.     Capillary Refill: Capillary refill takes less than 2 seconds.  Neurological:     General: No focal deficit present.     Mental Status: She is alert and oriented to person, place, and time. Mental status is at baseline.  Psychiatric:        Mood and Affect: Mood normal.        Thought Content: Thought content normal.        Judgment: Judgment normal.         Lab Results  Component Value Date   WBC 6.0 04/04/2022   HGB 13.4 04/04/2022   HCT 41.3 04/04/2022   PLT 312 04/04/2022   GLUCOSE 78 04/04/2022   CHOL 216 (H) 04/04/2022   TRIG 123 04/04/2022   HDL 51 04/04/2022   LDLCALC 143 (H) 04/04/2022   ALT 19 04/04/2022   AST 23 04/04/2022   NA 140 04/04/2022   K 5.1 04/04/2022   CL 101 04/04/2022   CREATININE 0.79 04/04/2022   BUN 17 04/04/2022   CO2 24 04/04/2022   TSH 0.190 (L) 04/04/2022      Assessment & Plan:   Problem List Items Addressed This Visit       Endocrine   Other specified hypothyroidism - Primary   Relevant Orders   CBC with Differential/Platelet (Completed)   TSH (Completed)   T4, Free (Completed) Patient is known to have hypothyroidism and is n treatment with levothyroxine .  Patient was diagnosed 10 years ago.  Other treatment includes no surgeries.  Patient is compliant with medicines and last TSH 6 months ago.  Last TSH was normal.      Other   Mixed hyperlipidemia   Relevant Orders   Lipid panel (Completed)   CBC with Differential/Platelet (Completed)   Comprehensive metabolic panel (Completed) AN INDIVIDUAL CARE PLAN for hyperlipidemia/ cholesterol was established and reinforced today.  The patient's status was assessed  using clinical findings on exam, lab and other diagnostic tests. The patient's disease status was assessed based on evidence-based guidelines and found to be fair controlled. MEDICATIONS were reviewed. SELF MANAGEMENT GOALS have been discussed and patient's success at attaining the goal of low cholesterol was assessed. RECOMMENDATION given include regular  exercise 3 days a week and low cholesterol/low fat diet. CLINICAL SUMMARY including written plan to identify barriers unique to the patient due to social or economic  reasons was discussed.     BMI 26.0-26.9,adult Continue diet and exercise   Other Visit Diagnoses     Menopausal and female climacteric states       Relevant Orders   HM DEXA SCAN (Completed)     .  Meds ordered this encounter  Medications   DISCONTD: SYNTHROID 100 MCG tablet    Sig: Take 1 tablet (100 mcg total) by mouth daily before breakfast.    Dispense:  180 tablet    Refill:  2    Orders Placed This Encounter  Procedures   HM DEXA SCAN   Lipid panel   CBC with Differential/Platelet   TSH   Comprehensive metabolic panel   T4, Free   Cardiovascular Risk Assessment     Follow-up: Return in about 6 months (around 10/05/2022).  An After Visit Summary was printed and given to the patient.  Brent Bulla, MD [facility] [phone]

## 2022-04-04 ENCOUNTER — Encounter: Payer: Self-pay | Admitting: Legal Medicine

## 2022-04-04 ENCOUNTER — Ambulatory Visit (INDEPENDENT_AMBULATORY_CARE_PROVIDER_SITE_OTHER): Payer: Medicare Other | Admitting: Legal Medicine

## 2022-04-04 VITALS — BP 110/60 | HR 78 | Temp 98.6°F | Resp 15 | Ht 62.0 in | Wt 146.0 lb

## 2022-04-04 DIAGNOSIS — Z6826 Body mass index (BMI) 26.0-26.9, adult: Secondary | ICD-10-CM | POA: Diagnosis not present

## 2022-04-04 DIAGNOSIS — E038 Other specified hypothyroidism: Secondary | ICD-10-CM | POA: Diagnosis not present

## 2022-04-04 DIAGNOSIS — N951 Menopausal and female climacteric states: Secondary | ICD-10-CM | POA: Diagnosis not present

## 2022-04-04 DIAGNOSIS — E782 Mixed hyperlipidemia: Secondary | ICD-10-CM | POA: Diagnosis not present

## 2022-04-04 MED ORDER — SYNTHROID 100 MCG PO TABS: 100.0000 ug | ORAL_TABLET | Freq: Every day | ORAL | 2 refills | Status: DC

## 2022-04-05 LAB — CBC WITH DIFFERENTIAL/PLATELET
Basophils Absolute: 0.1 10*3/uL (ref 0.0–0.2)
Basos: 1 %
EOS (ABSOLUTE): 0.1 10*3/uL (ref 0.0–0.4)
Eos: 1 %
Hematocrit: 41.3 % (ref 34.0–46.6)
Hemoglobin: 13.4 g/dL (ref 11.1–15.9)
Immature Grans (Abs): 0 10*3/uL (ref 0.0–0.1)
Immature Granulocytes: 1 %
Lymphocytes Absolute: 2.9 10*3/uL (ref 0.7–3.1)
Lymphs: 47 %
MCH: 29.1 pg (ref 26.6–33.0)
MCHC: 32.4 g/dL (ref 31.5–35.7)
MCV: 90 fL (ref 79–97)
Monocytes Absolute: 0.6 10*3/uL (ref 0.1–0.9)
Monocytes: 9 %
Neutrophils Absolute: 2.4 10*3/uL (ref 1.4–7.0)
Neutrophils: 41 %
Platelets: 312 10*3/uL (ref 150–450)
RBC: 4.6 x10E6/uL (ref 3.77–5.28)
RDW: 12.4 % (ref 11.7–15.4)
WBC: 6 10*3/uL (ref 3.4–10.8)

## 2022-04-05 LAB — LIPID PANEL
Chol/HDL Ratio: 4.2 ratio (ref 0.0–4.4)
Cholesterol, Total: 216 mg/dL — ABNORMAL HIGH (ref 100–199)
HDL: 51 mg/dL (ref >39)
LDL Chol Calc (NIH): 143 mg/dL — ABNORMAL HIGH (ref 0–99)
Triglycerides: 123 mg/dL (ref 0–149)
VLDL Cholesterol Cal: 22 mg/dL (ref 5–40)

## 2022-04-05 LAB — T4, FREE: Free T4: 1.64 ng/dL (ref 0.82–1.77)

## 2022-04-05 LAB — COMPREHENSIVE METABOLIC PANEL
ALT: 19 IU/L (ref 0–32)
AST: 23 IU/L (ref 0–40)
Albumin/Globulin Ratio: 1.8 (ref 1.2–2.2)
Albumin: 4.6 g/dL (ref 3.9–4.9)
Alkaline Phosphatase: 72 IU/L (ref 44–121)
BUN/Creatinine Ratio: 22 (ref 12–28)
BUN: 17 mg/dL (ref 8–27)
Bilirubin Total: 0.4 mg/dL (ref 0.0–1.2)
CO2: 24 mmol/L (ref 20–29)
Calcium: 9.8 mg/dL (ref 8.7–10.3)
Chloride: 101 mmol/L (ref 96–106)
Creatinine, Ser: 0.79 mg/dL (ref 0.57–1.00)
Globulin, Total: 2.5 g/dL (ref 1.5–4.5)
Glucose: 78 mg/dL (ref 70–99)
Potassium: 5.1 mmol/L (ref 3.5–5.2)
Sodium: 140 mmol/L (ref 134–144)
Total Protein: 7.1 g/dL (ref 6.0–8.5)
eGFR: 82 mL/min/{1.73_m2} (ref >59)

## 2022-04-05 LAB — CARDIOVASCULAR RISK ASSESSMENT

## 2022-04-05 LAB — TSH: TSH: 0.19 u[IU]/mL — ABNORMAL LOW (ref 0.450–4.500)

## 2022-04-05 NOTE — Progress Notes (Signed)
Free T4 normal, await TSH lp

## 2022-04-06 ENCOUNTER — Other Ambulatory Visit: Payer: Self-pay

## 2022-04-06 MED ORDER — SYNTHROID 88 MCG PO TABS: 88.0000 ug | ORAL_TABLET | Freq: Every day | ORAL | 0 refills | Status: DC

## 2022-04-06 NOTE — Progress Notes (Signed)
Cholesterol 143 high really needs to consider stain or other form of cholesterol lowering, TSH low 0.190, add free T4 level, we may need to change dosage of thyroid lp

## 2022-05-21 ENCOUNTER — Other Ambulatory Visit: Payer: Medicare Other

## 2022-05-21 DIAGNOSIS — E038 Other specified hypothyroidism: Secondary | ICD-10-CM

## 2022-05-22 ENCOUNTER — Other Ambulatory Visit: Payer: Self-pay

## 2022-05-22 LAB — TSH: TSH: 1.46 u[IU]/mL (ref 0.450–4.500)

## 2022-05-22 MED ORDER — SYNTHROID 88 MCG PO TABS: 88.0000 ug | ORAL_TABLET | Freq: Every day | ORAL | 1 refills | Status: DC

## 2022-05-22 NOTE — Progress Notes (Signed)
TSH is 1.46 normal an stable, continue dose

## 2022-06-21 ENCOUNTER — Encounter: Payer: Self-pay | Admitting: Nurse Practitioner

## 2022-06-21 ENCOUNTER — Ambulatory Visit (INDEPENDENT_AMBULATORY_CARE_PROVIDER_SITE_OTHER): Payer: Medicare Other | Admitting: Nurse Practitioner

## 2022-06-21 VITALS — BP 120/70 | HR 83 | Temp 97.3°F | Resp 14 | Ht 62.0 in | Wt 150.0 lb

## 2022-06-21 DIAGNOSIS — J018 Other acute sinusitis: Secondary | ICD-10-CM

## 2022-06-21 DIAGNOSIS — Z87891 Personal history of nicotine dependence: Secondary | ICD-10-CM | POA: Diagnosis not present

## 2022-06-21 DIAGNOSIS — J40 Bronchitis, not specified as acute or chronic: Secondary | ICD-10-CM

## 2022-06-21 MED ORDER — FLUTICASONE PROPIONATE 50 MCG/ACT NA SUSP: 2.0000 | Freq: Every day | NASAL | 6 refills | Status: DC

## 2022-06-21 MED ORDER — PROMETHAZINE-DM 6.25-15 MG/5ML PO SYRP: 5.0000 mL | ORAL_SOLUTION | Freq: Four times a day (QID) | ORAL | 0 refills | Status: DC | PRN

## 2022-06-21 MED ORDER — AZITHROMYCIN 250 MG PO TABS: ORAL_TABLET | ORAL | 0 refills | Status: AC

## 2022-06-21 MED ORDER — ALBUTEROL SULFATE (2.5 MG/3ML) 0.083% IN NEBU: 2.5000 mg | INHALATION_SOLUTION | Freq: Four times a day (QID) | RESPIRATORY_TRACT | 1 refills | Status: DC | PRN

## 2022-06-21 NOTE — Progress Notes (Addendum)
Acute Office Visit  Subjective:    Patient ID: Molly Carroll, female    DOB: 06-Mar-1956, 66 y.o.   MRN: [id number]  CC: URI  HPI: Patient is in today for nasal congestion, sinus pressure, sore throat, cough, and hoarseness since 2 weeks ago. Denies fever, chills, or rash. She tried some antihistamines OTC, but it is not helping. Saline rinses.  She also some noticed hoarseness.former smoker, quit 16 years ago.   Past Medical History:  Diagnosis Date   COVID-19 05/31/2019   Mixed hyperlipidemia    Other specified hypothyroidism     Past Surgical History:  Procedure Laterality Date   CHOLECYSTECTOMY  1991   HYSTERECTOMY ABDOMINAL WITH SALPINGECTOMY  1993   KNEE SURGERY Left 2005    Family History  Problem Relation Age of Onset   Aortic aneurysm Mother    Prostate cancer Father     Social History   Socioeconomic History   Marital status: Married    Spouse name: Not on file   Number of children: 2   Years of education: Not on file   Highest education level: Not on file  Occupational History   Occupation: Housewife  Tobacco Use   Smoking status: Former    Types: Cigarettes    Quit date: 2007    Years since quitting: 16.8   Smokeless tobacco: Never  Substance and Sexual Activity   Alcohol use: Not Currently   Drug use: Never   Sexual activity: Yes    Partners: Male  Other Topics Concern   Not on file  Social History Narrative   Not on file   Social Determinants of Health   Financial Resource Strain: Not on file  Food Insecurity: Not on file  Transportation Needs: Not on file  Physical Activity: Not on file  Stress: Not on file  Social Connections: Not on file  Intimate Partner Violence: Not on file    Outpatient Medications Prior to Visit  Medication Sig Dispense Refill   SYNTHROID 88 MCG tablet Take 1 tablet (88 mcg total) by mouth daily before breakfast. 90 tablet 1   No facility-administered medications prior to visit.    No Known  Allergies  Review of Systems  Constitutional:  Negative for chills, fatigue and fever.  HENT:  Positive for congestion, sinus pain, sore throat and voice change. Negative for ear pain.   Respiratory:  Positive for cough. Negative for shortness of breath.   Cardiovascular:  Negative for chest pain.  Musculoskeletal:  Negative for myalgias.  Neurological:  Negative for headaches.       Objective:    Physical Exam Vitals reviewed.  Constitutional:      Appearance: She is ill-appearing.  HENT:     Right Ear: Tympanic membrane normal.     Left Ear: Tympanic membrane normal.     Nose: Congestion and rhinorrhea present.     Mouth/Throat:     Pharynx: Posterior oropharyngeal erythema present.  Cardiovascular:     Rate and Rhythm: Normal rate and regular rhythm.  Pulmonary:     Effort: Pulmonary effort is normal.     Breath sounds: Wheezing present.  Lymphadenopathy:     Cervical: No cervical adenopathy.  Skin:    General: Skin is warm.  Neurological:     Mental Status: She is alert.     BP 120/70   Pulse 83   Temp (!) 97.3 F (36.3 C)   Resp 14   Ht '5\' 2"'  (1.575 m)  Wt 150 lb (68 kg)   SpO2 96%   BMI 27.44 kg/m   Wt Readings from Last 3 Encounters:  04/04/22 146 lb (66.2 kg)  10/05/21 148 lb (67.1 kg)  03/30/21 145 lb 6.4 oz (66 kg)    Health Maintenance Due  Topic Date Due   INFLUENZA VACCINE  04/03/2022    Lab Results  Component Value Date   TSH 1.460 05/21/2022   Lab Results  Component Value Date   WBC 6.0 04/04/2022   HGB 13.4 04/04/2022   HCT 41.3 04/04/2022   MCV 90 04/04/2022   PLT 312 04/04/2022   Lab Results  Component Value Date   NA 140 04/04/2022   K 5.1 04/04/2022   CO2 24 04/04/2022   GLUCOSE 78 04/04/2022   BUN 17 04/04/2022   CREATININE 0.79 04/04/2022   BILITOT 0.4 04/04/2022   ALKPHOS 72 04/04/2022   AST 23 04/04/2022   ALT 19 04/04/2022   PROT 7.1 04/04/2022   ALBUMIN 4.6 04/04/2022   CALCIUM 9.8 04/04/2022   EGFR 82  04/04/2022   Lab Results  Component Value Date   CHOL 216 (H) 04/04/2022   Lab Results  Component Value Date   HDL 51 04/04/2022   Lab Results  Component Value Date   LDLCALC 143 (H) 04/04/2022   Lab Results  Component Value Date   TRIG 123 04/04/2022   Lab Results  Component Value Date   CHOLHDL 4.2 04/04/2022        Assessment & Plan:   1. Acute non-recurrent sinusitis of other sinus - azithromycin (ZITHROMAX) 250 MG tablet; Take 2 tablets on day 1, then 1 tablet daily on days 2 through 5  Dispense: 6 tablet; Refill: 0 - fluticasone (FLONASE) 50 MCG/ACT nasal spray; Place 2 sprays into both nostrils daily.  Dispense: 16 g; Refill: 6 - promethazine-dextromethorphan (PROMETHAZINE-DM) 6.25-15 MG/5ML syrup; Take 5 mLs by mouth 4 (four) times daily as needed.  Dispense: 118 mL; Refill: 0  2. Bronchitis - albuterol (PROVENTIL) (2.5 MG/3ML) 0.083% nebulizer solution; Take 3 mLs (2.5 mg total) by nebulization every 6 (six) hours as needed for wheezing or shortness of breath.  Dispense: 150 mL; Refill: 1   Take Mucinex as directed Rest and push fluids Take Z-pack as prescribed Take Promethazine-Dm as needed for cough Use Flonase spray  Use Albuterol inhaler as needed Follow-up as needed  Follow-up: PRN  An After Visit Summary was printed and given to the patient.  I, Rip Harbour, NP, have reviewed all documentation for this visit. The documentation on 06/21/22 for the exam, diagnosis, procedures, and orders are all accurate and complete.   Signed, Rip Harbour, NP [place] [phone]

## 2022-06-21 NOTE — Patient Instructions (Addendum)
Take Mucinex as directed Rest and push fluids Take Z-pack as prescribed Take Promethazine-Dm as needed for cough Use Flonase spray  Use Albuterol inhaler as needed Follow-up as needed    Acute Bronchitis, Adult  Acute bronchitis is sudden inflammation of the main airways (bronchi) that come off the windpipe (trachea) in the lungs. The swelling causes the airways to get smaller and make more mucus than normal. This can make it hard to breathe and can cause coughing or noisy breathing (wheezing). Acute bronchitis may last several weeks. The cough may last longer. Allergies, asthma, and exposure to smoke may make the condition worse. What are the causes? This condition can be caused by germs and by substances that irritate the lungs, including: Cold and flu viruses. The most common cause of this condition is the virus that causes the common cold. Bacteria. This is less common. Breathing in substances that irritate the lungs, including: Smoke from cigarettes and other forms of tobacco. Dust and pollen. Fumes from household cleaning products, gases, or burned fuel. Indoor or outdoor air pollution. What increases the risk? The following factors may make you more likely to develop this condition: A weak body's defense system, also called the immune system. A condition that affects your lungs and breathing, such as asthma. What are the signs or symptoms? Common symptoms of this condition include: Coughing. This may bring up clear, yellow, or green mucus from your lungs (sputum). Wheezing. Runny or stuffy nose. Having too much mucus in your lungs (chest congestion). Shortness of breath. Aches and pains, including sore throat or chest. How is this diagnosed? This condition is usually diagnosed based on: Your symptoms and medical history. A physical exam. You may also have other tests, including tests to rule out other conditions, such as pneumonia. These tests include: A test of lung  function. Test of a mucus sample to look for the presence of bacteria. Tests to check the oxygen level in your blood. Blood tests. Chest X-ray. How is this treated? Most cases of acute bronchitis clear up over time without treatment. Your health care provider may recommend: Drinking more fluids to help thin your mucus so it is easier to cough up. Taking inhaled medicine (inhaler) to improve air flow in and out of your lungs. Using a vaporizer or a humidifier. These are machines that add water to the air to help you breathe better. Taking a medicine that thins mucus and clears congestion (expectorant). Taking a medicine that prevents or stops coughing (cough suppressant). It is not common to take an antibiotic medicine for this condition. Follow these instructions at home:  Take over-the-counter and prescription medicines only as told by your health care provider. Use an inhaler, vaporizer, or humidifier as told by your health care provider. Take two teaspoons (10 mL) of honey at bedtime to lessen coughing at night. Drink enough fluid to keep your urine pale yellow. Do not use any products that contain nicotine or tobacco. These products include cigarettes, chewing tobacco, and vaping devices, such as e-cigarettes. If you need help quitting, ask your health care provider. Get plenty of rest. Return to your normal activities as told by your health care provider. Ask your health care provider what activities are safe for you. Keep all follow-up visits. This is important. How is this prevented? To lower your risk of getting this condition again: Wash your hands often with soap and water for at least 20 seconds. If soap and water are not available, use hand sanitizer. Avoid  contact with people who have cold symptoms. Try not to touch your mouth, nose, or eyes with your hands. Avoid breathing in smoke or chemical fumes. Breathing smoke or chemical fumes will make your condition worse. Get the  flu shot every year. Contact a health care provider if: Your symptoms do not improve after 2 weeks. You have trouble coughing up the mucus. Your cough keeps you awake at night. You have a fever. Get help right away if you: Cough up blood. Feel pain in your chest. Have severe shortness of breath. Faint or keep feeling like you are going to faint. Have a severe headache. Have a fever or chills that get worse. These symptoms may represent a serious problem that is an emergency. Do not wait to see if the symptoms will go away. Get medical help right away. Call your local emergency services (911 in the [place] Do not drive yourself to the hospital. Summary Acute bronchitis is inflammation of the main airways (bronchi) that come off the windpipe (trachea) in the lungs. The swelling causes the airways to get smaller and make more mucus than normal. Drinking more fluids can help thin your mucus so it is easier to cough up. Take over-the-counter and prescription medicines only as told by your health care provider. Do not use any products that contain nicotine or tobacco. These products include cigarettes, chewing tobacco, and vaping devices, such as e-cigarettes. If you need help quitting, ask your health care provider. Contact a health care provider if your symptoms do not improve after 2 weeks. This information is not intended to replace advice given to you by your health care provider. Make sure you discuss any questions you have with your health care provider. Document Revised: 11/30/2021 Document Reviewed: 12/21/2020 Elsevier Patient Education  2023 [employer] Sinus Infection, Adult A sinus infection is soreness and swelling (inflammation) of your sinuses. Sinuses are hollow spaces in the bones around your face. They are located: Around your eyes. In the middle of your forehead. Behind your nose. In your cheekbones. Your sinuses and nasal passages are lined with a fluid called mucus.  Mucus drains out of your sinuses. Swelling can trap mucus in your sinuses. This lets germs (bacteria, virus, or fungus) grow, which leads to infection. Most of the time, this condition is caused by a virus. What are the causes? Allergies. Asthma. Germs. Things that block your nose or sinuses. Growths in the nose (nasal polyps). Chemicals or irritants in the air. A fungus. This is rare. What increases the risk? Having a weak body defense system (immune system). Doing a lot of swimming or diving. Using nasal sprays too much. Smoking. What are the signs or symptoms? The main symptoms of this condition are pain and a feeling of pressure around the sinuses. Other symptoms include: Stuffy nose (congestion). This may make it hard to breathe through your nose. Runny nose (drainage). Soreness, swelling, and warmth in the sinuses. A cough that may get worse at night. Being unable to smell and taste. Mucus that collects in the throat or the back of the nose (postnasal drip). This may cause a sore throat or bad breath. Being very tired (fatigued). A fever. How is this diagnosed? Your symptoms. Your medical history. A physical exam. Tests to find out if your condition is short-term (acute) or long-term (chronic). Your doctor may: Check your nose for growths (polyps). Check your sinuses using a tool that has a light on one end (endoscope). Check for allergies or germs. Do  imaging tests, such as an MRI or CT scan. How is this treated? Treatment for this condition depends on the cause and whether it is short-term or long-term. If caused by a virus, your symptoms should go away on their own within 10 days. You may be given medicines to relieve symptoms. They include: Medicines that shrink swollen tissue in the nose. A spray that treats swelling of the nostrils. Rinses that help get rid of thick mucus in your nose (nasal saline washes). Medicines that treat allergies  (antihistamines). Over-the-counter pain relievers. If caused by bacteria, your doctor may wait to see if you will get better without treatment. You may be given antibiotic medicine if you have: A very bad infection. A weak body defense system. If caused by growths in the nose, surgery may be needed. Follow these instructions at home: Medicines Take, use, or apply over-the-counter and prescription medicines only as told by your doctor. These may include nasal sprays. If you were prescribed an antibiotic medicine, take it as told by your doctor. Do not stop taking it even if you start to feel better. Hydrate and humidify  Drink enough water to keep your pee (urine) pale yellow. Use a cool mist humidifier to keep the humidity level in your home above 50%. Breathe in steam for 10-15 minutes, 3-4 times a day, or as told by your doctor. You can do this in the bathroom while a hot shower is running. Try not to spend time in cool or dry air. Rest Rest as much as you can. Sleep with your head raised (elevated). Make sure you get enough sleep each night. General instructions  Put a warm, moist washcloth on your face 3-4 times a day, or as often as told by your doctor. Use nasal saline washes as often as told by your doctor. Wash your hands often with soap and water. If you cannot use soap and water, use hand sanitizer. Do not smoke. Avoid being around people who are smoking (secondhand smoke). Keep all follow-up visits. Contact a doctor if: You have a fever. Your symptoms get worse. Your symptoms do not get better within 10 days. Get help right away if: You have a very bad headache. You cannot stop vomiting. You have very bad pain or swelling around your face or eyes. You have trouble seeing. You feel confused. Your neck is stiff. You have trouble breathing. These symptoms may be an emergency. Get help right away. Call 911. Do not wait to see if the symptoms will go away. Do not drive  yourself to the hospital. Summary A sinus infection is swelling of your sinuses. Sinuses are hollow spaces in the bones around your face. This condition is caused by tissues in your nose that become inflamed or swollen. This traps germs. These can lead to infection. If you were prescribed an antibiotic medicine, take it as told by your doctor. Do not stop taking it even if you start to feel better. Keep all follow-up visits. This information is not intended to replace advice given to you by your health care provider. Make sure you discuss any questions you have with your health care provider. Document Revised: 07/25/2021 Document Reviewed: 07/25/2021 Elsevier Patient Education  [place].

## 2022-06-22 ENCOUNTER — Other Ambulatory Visit: Payer: Self-pay | Admitting: Nurse Practitioner

## 2022-06-22 DIAGNOSIS — J40 Bronchitis, not specified as acute or chronic: Secondary | ICD-10-CM

## 2022-06-22 MED ORDER — ALBUTEROL SULFATE HFA 108 (90 BASE) MCG/ACT IN AERS: 2.0000 | INHALATION_SPRAY | Freq: Four times a day (QID) | RESPIRATORY_TRACT | 2 refills | Status: DC | PRN

## 2022-10-12 ENCOUNTER — Ambulatory Visit: Payer: No Typology Code available for payment source | Admitting: Nurse Practitioner

## 2022-10-12 ENCOUNTER — Encounter: Payer: Self-pay | Admitting: Nurse Practitioner

## 2022-10-12 ENCOUNTER — Ambulatory Visit (INDEPENDENT_AMBULATORY_CARE_PROVIDER_SITE_OTHER): Payer: Medicare Other | Admitting: Nurse Practitioner

## 2022-10-12 VITALS — BP 140/80 | HR 85 | Temp 97.2°F | Resp 18 | Ht 62.0 in | Wt 165.4 lb

## 2022-10-12 DIAGNOSIS — E6609 Other obesity due to excess calories: Secondary | ICD-10-CM | POA: Diagnosis not present

## 2022-10-12 DIAGNOSIS — E66811 Other obesity due to excess calories: Secondary | ICD-10-CM | POA: Insufficient documentation

## 2022-10-12 DIAGNOSIS — R030 Elevated blood-pressure reading, without diagnosis of hypertension: Secondary | ICD-10-CM | POA: Diagnosis not present

## 2022-10-12 DIAGNOSIS — E038 Other specified hypothyroidism: Secondary | ICD-10-CM | POA: Diagnosis not present

## 2022-10-12 DIAGNOSIS — Z6830 Body mass index (BMI) 30.0-30.9, adult: Secondary | ICD-10-CM

## 2022-10-12 DIAGNOSIS — E782 Mixed hyperlipidemia: Secondary | ICD-10-CM | POA: Diagnosis not present

## 2022-10-12 LAB — HEMOGLOBIN A1C
Est. average glucose Bld gHb Est-mCnc: 114 mg/dL
Hgb A1c MFr Bld: 5.6 % (ref 4.8–5.6)

## 2022-10-12 NOTE — Assessment & Plan Note (Signed)
Last LDL high If it is still high on this lab today, we need to consider low dose of statin  Nutrition: Stressed importance of moderation in sodium intake, saturated fat and cholesterol, caloric balance, sufficient intake of complex carbohydrates, fiber, calcium and iron.   Exercise: Stressed the importance of regular exercise.

## 2022-10-12 NOTE — Assessment & Plan Note (Signed)
Continue synthroid 54mg daily  Will check TSH and T3  Nutrition: Stressed importance of moderation in sodium intake, saturated fat and cholesterol, caloric balance, sufficient intake of complex carbohydrates, fiber, calcium and iron.   Exercise: Stressed the importance of regular exercise.

## 2022-10-12 NOTE — Progress Notes (Signed)
Subjective:  Patient ID: Molly Carroll, female    DOB: May 13, 1956  Age: 67 y.o. MRN: [id number]  Chief Complaints: Regular follow up  History of Present illness:  Patient is here for her follow up with thyroid and lipid. She said she was in statin many years ago and stopped it but now she thinks her weight is coming back. She gained 15 lb in past 4 months. She likes to eat a lot, she does not watch her diet as she supposed to be. She lives home with husband, she is not exercising at all. Currently she is taking only a medicine for her thyroid. She wants to be checked for diabetes and recheck her thyroid and cholesterol.  She is upto date with her vaccines. Patient said she has done bone density, mammogram, abdominal aortic exam in a life line mobile screening.    Lipid/Cholesterol, Follow-up  Last lipid panel Other pertinent labs  Lab Results  Component Value Date   CHOL 216 (H) 04/04/2022   HDL 51 04/04/2022   LDLCALC 143 (H) 04/04/2022   TRIG 123 04/04/2022   CHOLHDL 4.2 04/04/2022   Lab Results  Component Value Date   ALT 19 04/04/2022   AST 23 04/04/2022   PLT 312 04/04/2022   TSH 1.460 05/21/2022     She was last seen for this 6 months ago.  Management includes .no any medicines, trying to do with diet.   She reports good compliance with treatment. She is not having side effects. Current diet: in general, a "healthy" diet   Current exercise: none  The 10-year ASCVD risk score (Arnett DK, et al., 2019) is: 7.8%    Hypothyroidism - Medications: Synthroid 4mg  - Current symptoms:  weight changes - Denies heat / cold intolerance, nervousness, and palpitations - Symptoms have been well-controlled    Current Outpatient Medications on File Prior to Visit  Medication Sig Dispense Refill   SYNTHROID 88 MCG tablet Take 1 tablet (88 mcg total) by mouth daily before breakfast. 90 tablet 1   No current facility-administered medications on file prior to visit.   Past  Medical History:  Diagnosis Date   COVID-19 05/31/2019   Mixed hyperlipidemia    Other specified hypothyroidism    Past Surgical History:  Procedure Laterality Date   CHOLECYSTECTOMY  1991   HYSTERECTOMY ABDOMINAL WITH SALPINGECTOMY  1993   KNEE SURGERY Left 2005    Family History  Problem Relation Age of Onset   Aortic aneurysm Mother    Prostate cancer Father    Social History   Socioeconomic History   Marital status: Married    Spouse name: Not on file   Number of children: 2   Years of education: Not on file   Highest education level: Not on file  Occupational History   Occupation: Housewife  Tobacco Use   Smoking status: Former    Types: Cigarettes    Quit date: 2007    Years since quitting: 17.1   Smokeless tobacco: Never  Substance and Sexual Activity   Alcohol use: Not Currently   Drug use: Never   Sexual activity: Yes    Partners: Male  Other Topics Concern   Not on file  Social History Narrative   Not on file   Social Determinants of Health   Financial Resource Strain: Not on file  Food Insecurity: Not on file  Transportation Needs: Not on file  Physical Activity: Not on file  Stress: Not on file  Social  Connections: Not on file    Review of Systems  Constitutional:  Negative for chills and fatigue.  HENT:  Negative for congestion, ear pain, sinus pain and sore throat.   Respiratory:  Negative for cough and shortness of breath.   Cardiovascular:  Negative for chest pain and leg swelling.  Gastrointestinal:  Negative for abdominal pain, constipation, diarrhea, nausea and vomiting.  Genitourinary:  Negative for dysuria and frequency.  Musculoskeletal:  Negative for arthralgias, back pain and myalgias.  Neurological:  Negative for dizziness and headaches.     Objective:  BP (!) 140/80 (BP Location: Right Arm)   Pulse 85   Temp (!) 97.2 F (36.2 C)   Resp 18   Ht 5' 2"$  (1.575 m)   Wt 165 lb 6.4 oz (75 kg)   SpO2 96%   BMI 30.25 kg/m       10/12/2022   10:25 AM 10/12/2022    9:54 AM 06/21/2022   10:21 AM  BP/Weight  Systolic BP [id number]  Diastolic BP 80 70 70  Wt. (Lbs)  165.4 150  BMI  30.25 kg/m2 27.44 kg/m2    Physical Exam Vitals reviewed.  Constitutional:      Appearance: Normal appearance. She is obese.  Neck:     Vascular: No carotid bruit.  Cardiovascular:     Rate and Rhythm: Normal rate and regular rhythm.     Heart sounds: Normal heart sounds.  Pulmonary:     Effort: Pulmonary effort is normal.     Breath sounds: Normal breath sounds.  Abdominal:     General: Abdomen is flat. Bowel sounds are normal.     Palpations: Abdomen is soft.     Tenderness: There is no abdominal tenderness.  Musculoskeletal:     Cervical back: Normal range of motion.  Neurological:     Mental Status: She is alert and oriented to person, place, and time.  Psychiatric:        Mood and Affect: Mood normal.        Behavior: Behavior normal.         10/12/2022   11:12 AM 04/04/2022   10:23 AM 03/30/2021    8:50 AM 03/30/2020    9:35 AM  Depression screen PHQ 2/9  Decreased Interest 0 0 0 0  Down, Depressed, Hopeless 0 0 0 0  PHQ - 2 Score 0 0 0 0       10/05/2021   10:20 AM 03/25/2022    3:07 PM 04/04/2022   10:23 AM 10/12/2022    9:59 AM 10/12/2022   11:13 AM  Fall Risk  Falls in the past year? 0 0 0 0 0  Was there an injury with Fall? 0  0 0 0  Fall Risk Category Calculator 0  0 0 0  Fall Risk Category (Retired) Low  Low    (RETIRED) Patient Fall Risk Level Low fall risk  Low fall risk    Patient at Risk for Falls Due to No Fall Risks  No Fall Risks No Fall Risks No Fall Risks  Fall risk Follow up   Falls prevention discussed      Lab Results  Component Value Date   WBC 6.0 04/04/2022   HGB 13.4 04/04/2022   HCT 41.3 04/04/2022   PLT 312 04/04/2022   GLUCOSE 78 04/04/2022   CHOL 216 (H) 04/04/2022   TRIG 123 04/04/2022   HDL 51 04/04/2022   LDLCALC 143 (H) 04/04/2022   ALT 19 04/04/2022  AST 23  04/04/2022   NA 140 04/04/2022   K 5.1 04/04/2022   CL 101 04/04/2022   CREATININE 0.79 04/04/2022   BUN 17 04/04/2022   CO2 24 04/04/2022   TSH 1.460 05/21/2022      Assessment & Plan:   Mixed hyperlipidemia Assessment & Plan: Last LDL high If it is still high on this lab today, we need to consider low dose of statin  Nutrition: Stressed importance of moderation in sodium intake, saturated fat and cholesterol, caloric balance, sufficient intake of complex carbohydrates, fiber, calcium and iron.   Exercise: Stressed the importance of regular exercise.     Orders: -     CBC with Differential/Platelet -     Comprehensive metabolic panel -     Lipid panel  Other specified hypothyroidism Assessment & Plan: Continue synthroid 75mg daily  Will check TSH and T3  Nutrition: Stressed importance of moderation in sodium intake, saturated fat and cholesterol, caloric balance, sufficient intake of complex carbohydrates, fiber, calcium and iron.   Exercise: Stressed the importance of regular exercise.     Orders: -     TSH -     T4, free  Class 1 obesity due to excess calories without serious comorbidity with body mass index (BMI) of 30.0 to 30.9 in adult Assessment & Plan: Will check lipid and HBA1C  Nutrition: Stressed importance of moderation in sodium intake, saturated fat and cholesterol, caloric balance, sufficient intake of complex carbohydrates, fiber, calcium and iron.   Exercise: Stressed the importance of regular exercise.     Orders: -     Lipid panel -     Hemoglobin A1c  Elevated blood-pressure reading without diagnosis of hypertension Assessment & Plan: Patient's BP in the clinic 140/70 and 140/80  Asked patient to come back in 2 weeks with a log book if BP is higher than 130/80 consistently  Nutrition: Stressed importance of moderation in sodium intake, saturated fat and cholesterol, caloric balance, sufficient intake of complex carbohydrates, fiber,  calcium and iron.   Exercise: Stressed the importance of regular exercise.         Follow-up: Return in about 4 months (around 02/10/2023) for FASTING, CHRONIC. I, RNeil Crouchhave reviewed all documentation for this visit. The documentation on 10/12/22   for the exam, diagnosis, procedures, and orders are all accurate and complete.     An After Visit Summary was printed and given to the patient.  RNeil Crouch DNP, [phone]

## 2022-10-12 NOTE — Assessment & Plan Note (Signed)
Will check lipid and HBA1C  Nutrition: Stressed importance of moderation in sodium intake, saturated fat and cholesterol, caloric balance, sufficient intake of complex carbohydrates, fiber, calcium and iron.   Exercise: Stressed the importance of regular exercise.

## 2022-10-12 NOTE — Patient Instructions (Addendum)
Please keep taking your BP for 2 weeks 3 times daily and keep a log of it. If it is more than 130/80 most of the reading , please follow up in 2 weeks with that reading, we have to talk about possible medicine.  DASH Eating Plan DASH stands for Dietary Approaches to Stop Hypertension. The DASH eating plan is a healthy eating plan that has been shown to: Reduce high blood pressure (hypertension). Reduce your risk for type 2 diabetes, heart disease, and stroke. Help with weight loss. What are tips for following this plan? Reading food labels Check food labels for the amount of salt (sodium) per serving. Choose foods with less than 5 percent of the Daily Value of sodium. Generally, foods with less than 300 milligrams (mg) of sodium per serving fit into this eating plan. To find whole grains, look for the word "whole" as the first word in the ingredient list. Shopping Buy products labeled as "low-sodium" or "no salt added." Buy fresh foods. Avoid canned foods and pre-made or frozen meals. Cooking Avoid adding salt when cooking. Use salt-free seasonings or herbs instead of table salt or sea salt. Check with your health care provider or pharmacist before using salt substitutes. Do not fry foods. Cook foods using healthy methods such as baking, boiling, grilling, roasting, and broiling instead. Cook with heart-healthy oils, such as olive, canola, avocado, soybean, or sunflower oil. Meal planning  Eat a balanced diet that includes: 4 or more servings of fruits and 4 or more servings of vegetables each day. Try to fill one-half of your plate with fruits and vegetables. 6-8 servings of whole grains each day. Less than 6 oz (170 g) of lean meat, poultry, or fish each day. A 3-oz (85-g) serving of meat is about the same size as a deck of cards. One egg equals 1 oz (28 g). 2-3 servings of low-fat dairy each day. One serving is 1 cup (237 mL). 1 serving of nuts, seeds, or beans 5 times each week. 2-3  servings of heart-healthy fats. Healthy fats called omega-3 fatty acids are found in foods such as walnuts, flaxseeds, fortified milks, and eggs. These fats are also found in cold-water fish, such as sardines, salmon, and mackerel. Limit how much you eat of: Canned or prepackaged foods. Food that is high in trans fat, such as some fried foods. Food that is high in saturated fat, such as fatty meat. Desserts and other sweets, sugary drinks, and other foods with added sugar. Full-fat dairy products. Do not salt foods before eating. Do not eat more than 4 egg yolks a week. Try to eat at least 2 vegetarian meals a week. Eat more home-cooked food and less restaurant, buffet, and fast food. Lifestyle When eating at a restaurant, ask that your food be prepared with less salt or no salt, if possible. If you drink alcohol: Limit how much you use to: 0-1 drink a day for women who are not pregnant. 0-2 drinks a day for men. Be aware of how much alcohol is in your drink. In the [place] one drink equals one 12 oz bottle of beer (355 mL), one 5 oz glass of wine (148 mL), or one 1 oz glass of hard liquor (44 mL). General information Avoid eating more than 2,300 mg of salt a day. If you have hypertension, you may need to reduce your sodium intake to 1,500 mg a day. Work with your health care provider to maintain a healthy body weight or to lose weight.  Ask what an ideal weight is for you. Get at least 30 minutes of exercise that causes your heart to beat faster (aerobic exercise) most days of the week. Activities may include walking, swimming, or biking. Work with your health care provider or dietitian to adjust your eating plan to your individual calorie needs. What foods should I eat? Fruits All fresh, dried, or frozen fruit. Canned fruit in natural juice (without added sugar). Vegetables Fresh or frozen vegetables (raw, steamed, roasted, or grilled). Low-sodium or reduced-sodium tomato and vegetable  juice. Low-sodium or reduced-sodium tomato sauce and tomato paste. Low-sodium or reduced-sodium canned vegetables. Grains Whole-grain or whole-wheat bread. Whole-grain or whole-wheat pasta. Brown rice. [place]. Bulgur. Whole-grain and low-sodium cereals. Pita bread. Low-fat, low-sodium crackers. Whole-wheat flour tortillas. Meats and other proteins Skinless chicken or [place]. Ground chicken or [place]. Pork with fat trimmed off. Fish and seafood. Egg whites. Dried beans, peas, or lentils. Unsalted nuts, nut butters, and seeds. Unsalted canned beans. Lean cuts of beef with fat trimmed off. Low-sodium, lean precooked or cured meat, such as sausages or meat loaves. Dairy Low-fat (1%) or fat-free (skim) milk. Reduced-fat, low-fat, or fat-free cheeses. Nonfat, low-sodium ricotta or cottage cheese. Low-fat or nonfat yogurt. Low-fat, low-sodium cheese. Fats and oils Soft margarine without trans fats. Vegetable oil. Reduced-fat, low-fat, or light mayonnaise and salad dressings (reduced-sodium). Canola, safflower, olive, avocado, soybean, and sunflower oils. Avocado. Seasonings and condiments Herbs. Spices. Seasoning mixes without salt. Other foods Unsalted popcorn and pretzels. Fat-free sweets. The items listed above may not be a complete list of foods and beverages you can eat. Contact a dietitian for more information. What foods should I avoid? Fruits Canned fruit in a light or heavy syrup. Fried fruit. Fruit in cream or butter sauce. Vegetables Creamed or fried vegetables. Vegetables in a cheese sauce. Regular canned vegetables (not low-sodium or reduced-sodium). Regular canned tomato sauce and paste (not low-sodium or reduced-sodium). Regular tomato and vegetable juice (not low-sodium or reduced-sodium). Angie Fava. Olives. Grains Baked goods made with fat, such as croissants, muffins, or some breads. Dry pasta or rice meal packs. Meats and other proteins Fatty cuts of meat. Ribs. Fried meat.  Berniece Salines. Bologna, salami, and other precooked or cured meats, such as sausages or meat loaves. Fat from the back of a pig (fatback). Bratwurst. Salted nuts and seeds. Canned beans with added salt. Canned or smoked fish. Whole eggs or egg yolks. Chicken or [place] with skin. Dairy Whole or 2% milk, cream, and half-and-half. Whole or full-fat cream cheese. Whole-fat or sweetened yogurt. Full-fat cheese. Nondairy creamers. Whipped toppings. Processed cheese and cheese spreads. Fats and oils Butter. Stick margarine. Lard. Shortening. Ghee. Bacon fat. Tropical oils, such as coconut, palm kernel, or palm oil. Seasonings and condiments Onion salt, garlic salt, seasoned salt, table salt, and sea salt. Worcestershire sauce. Tartar sauce. Barbecue sauce. Teriyaki sauce. Soy sauce, including reduced-sodium. Steak sauce. Canned and packaged gravies. Fish sauce. Oyster sauce. Cocktail sauce. Store-bought horseradish. Ketchup. Mustard. Meat flavorings and tenderizers. Bouillon cubes. Hot sauces. Pre-made or packaged marinades. Pre-made or packaged taco seasonings. Relishes. Regular salad dressings. Other foods Salted popcorn and pretzels. The items listed above may not be a complete list of foods and beverages you should avoid. Contact a dietitian for more information. Where to find more information National Heart, Lung, and Blood Institute: https://wilson-eaton.com/ American Heart Association: www.heart.org Academy of Nutrition and Dietetics: www.eatright.[place]: www.kidney.org Summary The DASH eating plan is a healthy eating plan that has been shown to  reduce high blood pressure (hypertension). It may also reduce your risk for type 2 diabetes, heart disease, and stroke. When on the DASH eating plan, aim to eat more fresh fruits and vegetables, whole grains, lean proteins, low-fat dairy, and heart-healthy fats. With the DASH eating plan, you should limit salt (sodium) intake to 2,300 mg a day. If  you have hypertension, you may need to reduce your sodium intake to 1,500 mg a day. Work with your health care provider or dietitian to adjust your eating plan to your individual calorie needs. This information is not intended to replace advice given to you by your health care provider. Make sure you discuss any questions you have with your health care provider. Document Revised: 07/24/2019 Document Reviewed: 07/24/2019 Elsevier Patient Education  [place].

## 2022-10-12 NOTE — Assessment & Plan Note (Addendum)
Patient's BP in the clinic 140/70 and 140/80  Asked patient to come back in 2 weeks with a log book if BP is higher than 130/80 consistently  Nutrition: Stressed importance of moderation in sodium intake, saturated fat and cholesterol, caloric balance, sufficient intake of complex carbohydrates, fiber, calcium and iron.   Exercise: Stressed the importance of regular exercise.

## 2022-10-13 ENCOUNTER — Other Ambulatory Visit: Payer: Self-pay | Admitting: Nurse Practitioner

## 2022-10-13 DIAGNOSIS — E782 Mixed hyperlipidemia: Secondary | ICD-10-CM

## 2022-10-13 LAB — COMPREHENSIVE METABOLIC PANEL
ALT: 24 IU/L (ref 0–32)
AST: 30 IU/L (ref 0–40)
Albumin/Globulin Ratio: 1.7 (ref 1.2–2.2)
Albumin: 4.5 g/dL (ref 3.9–4.9)
Alkaline Phosphatase: 79 IU/L (ref 44–121)
BUN/Creatinine Ratio: 19 (ref 12–28)
BUN: 15 mg/dL (ref 8–27)
Bilirubin Total: 0.3 mg/dL (ref 0.0–1.2)
CO2: 26 mmol/L (ref 20–29)
Calcium: 9.1 mg/dL (ref 8.7–10.3)
Chloride: 103 mmol/L (ref 96–106)
Creatinine, Ser: 0.77 mg/dL (ref 0.57–1.00)
Globulin, Total: 2.7 g/dL (ref 1.5–4.5)
Glucose: 75 mg/dL (ref 70–99)
Potassium: 5.3 mmol/L — ABNORMAL HIGH (ref 3.5–5.2)
Sodium: 141 mmol/L (ref 134–144)
Total Protein: 7.2 g/dL (ref 6.0–8.5)
eGFR: 85 mL/min/{1.73_m2} (ref >59)

## 2022-10-13 LAB — CBC WITH DIFFERENTIAL/PLATELET
Basophils Absolute: 0 10*3/uL (ref 0.0–0.2)
Basos: 1 %
EOS (ABSOLUTE): 0.1 10*3/uL (ref 0.0–0.4)
Eos: 1 %
Hematocrit: 39.9 % (ref 34.0–46.6)
Hemoglobin: 12.8 g/dL (ref 11.1–15.9)
Immature Grans (Abs): 0.1 10*3/uL (ref 0.0–0.1)
Immature Granulocytes: 1 %
Lymphocytes Absolute: 2.9 10*3/uL (ref 0.7–3.1)
Lymphs: 44 %
MCH: 28.8 pg (ref 26.6–33.0)
MCHC: 32.1 g/dL (ref 31.5–35.7)
MCV: 90 fL (ref 79–97)
Monocytes Absolute: 0.7 10*3/uL (ref 0.1–0.9)
Monocytes: 10 %
Neutrophils Absolute: 2.9 10*3/uL (ref 1.4–7.0)
Neutrophils: 43 %
Platelets: 293 10*3/uL (ref 150–450)
RBC: 4.45 x10E6/uL (ref 3.77–5.28)
RDW: 12.6 % (ref 11.7–15.4)
WBC: 6.6 10*3/uL (ref 3.4–10.8)

## 2022-10-13 LAB — LIPID PANEL
Chol/HDL Ratio: 4.3 ratio (ref 0.0–4.4)
Cholesterol, Total: 228 mg/dL — ABNORMAL HIGH (ref 100–199)
HDL: 53 mg/dL (ref >39)
LDL Chol Calc (NIH): 153 mg/dL — ABNORMAL HIGH (ref 0–99)
Triglycerides: 122 mg/dL (ref 0–149)
VLDL Cholesterol Cal: 22 mg/dL (ref 5–40)

## 2022-10-13 LAB — CARDIOVASCULAR RISK ASSESSMENT

## 2022-10-13 LAB — TSH: TSH: 3.47 u[IU]/mL (ref 0.450–4.500)

## 2022-10-13 LAB — T4, FREE: Free T4: 1.4 ng/dL (ref 0.82–1.77)

## 2022-10-13 MED ORDER — ROSUVASTATIN CALCIUM 5 MG PO TABS: 5.0000 mg | ORAL_TABLET | Freq: Every day | ORAL | 1 refills | Status: DC

## 2022-11-08 ENCOUNTER — Other Ambulatory Visit: Payer: Self-pay

## 2022-11-08 ENCOUNTER — Telehealth: Payer: Self-pay

## 2022-11-08 NOTE — Telephone Encounter (Signed)
Contacted Molly Carroll to schedule their annual wellness visit. Appointment made for 11/15/22.  Norton Blizzard, [place] ([facility])  [facility] [phone]

## 2022-11-09 MED ORDER — SYNTHROID 88 MCG PO TABS: 88.0000 ug | ORAL_TABLET | Freq: Every day | ORAL | 3 refills | Status: DC

## 2022-11-12 ENCOUNTER — Ambulatory Visit: Payer: No Typology Code available for payment source | Admitting: Nurse Practitioner

## 2022-11-15 ENCOUNTER — Ambulatory Visit: Payer: Medicare Other

## 2022-11-15 DIAGNOSIS — Z0000 Encounter for general adult medical examination without abnormal findings: Secondary | ICD-10-CM

## 2022-11-15 DIAGNOSIS — Z1331 Encounter for screening for depression: Secondary | ICD-10-CM

## 2022-11-15 NOTE — Patient Instructions (Signed)
Molly Carroll , Thank you for taking time to come for your Medicare Wellness Visit. I appreciate your ongoing commitment to your health goals. Please review the following plan we discussed and let me know if I can assist you in the future.   Screening recommendations/referrals: Colonoscopy: Due 05/2026 Mammogram: Due 03/2023 Bone Density: Due every 2 years Recommended yearly ophthalmology/optometry visit for glaucoma screening and checkup Recommended yearly dental visit for hygiene and checkup  Vaccinations: Influenza vaccine: Due yearly in the Fall Pneumococcal vaccine: Complete Tdap vaccine: Due - you can get this at the pharmacy Shingles vaccine: Complete     Preventive Care 65 Years and Older, Female   Preventive care refers to lifestyle choices and visits with your health care provider that can promote health and wellness.  What does preventive care include? A yearly physical exam. This is also called an annual well check. Dental exams once or twice a year. Routine eye exams. Ask your health care provider how often you should have your eyes checked. Personal lifestyle choices, including: Daily care of your teeth and gums. Regular physical activity. Eating a healthy diet. Avoiding tobacco and drug use. Limiting alcohol use. Practicing safe sex. Taking low-dose aspirin every day. Taking vitamin and mineral supplements as recommended by your health care provider.  What happens during an annual well check? The services and screenings done by your health care provider during your annual well check will depend on your age, overall health, lifestyle risk factors, and family history of disease.  Counseling Your health care provider may ask you questions about your: Alcohol use. Tobacco use. Drug use. Emotional well-being. Home and relationship well-being. Sexual activity. Eating habits. History of falls. Memory and ability to understand (cognition). Work and work  Statistician. Reproductive health.  Screening You may have the following tests or measurements: Height, weight, and BMI. Blood pressure. Lipid and cholesterol levels. These may be checked every 5 years, or more frequently if you are over 64 years old. Skin check. Lung cancer screening. You may have this screening every year starting at age 56 if you have a 30-pack-year history of smoking and currently smoke or have quit within the past 15 years. Fecal occult blood test (FOBT) of the stool. You may have this test every year starting at age 61. Flexible sigmoidoscopy or colonoscopy. You may have a sigmoidoscopy every 5 years or a colonoscopy every 10 years starting at age 35. Hepatitis C blood test. Hepatitis B blood test. Sexually transmitted disease (STD) testing. Diabetes screening. This is done by checking your blood sugar (glucose) after you have not eaten for a while (fasting). You may have this done every 1-3 years. Bone density scan. This is done to screen for osteoporosis. You may have this done starting at age 48. Mammogram. This may be done every 1-2 years. Talk to your health care provider about how often you should have regular mammograms. Talk with your health care provider about your test results, treatment options, and if necessary, the need for more tests.  Vaccines Your health care provider may recommend certain vaccines, such as: Influenza vaccine. This is recommended every year. Tetanus, diphtheria, and acellular pertussis (Tdap, Td) vaccine. You may need a Td booster every 10 years. Zoster vaccine. You may need this after age 47. Pneumococcal 13-valent conjugate (PCV13) vaccine. One dose is recommended after age 27. Pneumococcal polysaccharide (PPSV23) vaccine. One dose is recommended after age 62. Talk to your health care provider about which screenings and vaccines you need and  how often you need them.  This information is not intended to replace advice given to you by  your health care provider. Make sure you discuss any questions you have with your health care provider. Document Released: 09/16/2015 Document Revised: 05/09/2016 Document Reviewed: 06/21/2015 Elsevier Interactive Patient Education  2017 [employer] Prevention in the Home  Falls can cause injuries. They can happen to people of all ages. There are many things you can do to make your home safe and to help prevent falls.  What can I do on the outside of my home? Regularly fix the edges of walkways and driveways and fix any cracks. Remove anything that might make you trip as you walk through a door, such as a raised step or threshold. Trim any bushes or trees on the path to your home. Use bright outdoor lighting. Clear any walking paths of anything that might make someone trip, such as rocks or tools. Regularly check to see if handrails are loose or broken. Make sure that both sides of any steps have handrails. Any raised decks and porches should have guardrails on the edges. Have any leaves, snow, or ice cleared regularly. Use sand or salt on walking paths during winter. Clean up any spills in your garage right away. This includes oil or grease spills.  What can I do in the bathroom? Use night lights. Install grab bars by the toilet and in the tub and shower. Do not use towel bars as grab bars. Use non-skid mats or decals in the tub or shower. If you need to sit down in the shower, use a plastic, non-slip stool. Keep the floor dry. Clean up any water that spills on the floor as soon as it happens. Remove soap buildup in the tub or shower regularly. Attach bath mats securely with double-sided non-slip rug tape. Do not have throw rugs and other things on the floor that can make you trip.  What can I do in the bedroom? Use night lights. Make sure that you have a light by your bed that is easy to reach. Do not use any sheets or blankets that are too big for your bed. They should  not hang down onto the floor. Have a firm chair that has side arms. You can use this for support while you get dressed. Do not have throw rugs and other things on the floor that can make you trip.  What can I do in the kitchen? Clean up any spills right away. Avoid walking on wet floors. Keep items that you use a lot in easy-to-reach places. If you need to reach something above you, use a strong step stool that has a grab bar. Keep electrical cords out of the way. Do not use floor polish or wax that makes floors slippery. If you must use wax, use non-skid floor wax. Do not have throw rugs and other things on the floor that can make you trip.  What can I do with my stairs? Do not leave any items on the stairs. Make sure that there are handrails on both sides of the stairs and use them. Fix handrails that are broken or loose. Make sure that handrails are as long as the stairways. Check any carpeting to make sure that it is firmly attached to the stairs. Fix any carpet that is loose or worn. Avoid having throw rugs at the top or bottom of the stairs. If you do have throw rugs, attach them to the floor  with carpet tape. Make sure that you have a light switch at the top of the stairs and the bottom of the stairs. If you do not have them, ask someone to add them for you.  What else can I do to help prevent falls? Wear shoes that: Do not have high heels. Have rubber bottoms. Are comfortable and fit you well. Are closed at the toe. Do not wear sandals. If you use a stepladder: Make sure that it is fully opened. Do not climb a closed stepladder. Make sure that both sides of the stepladder are locked into place. Ask someone to hold it for you, if possible. Clearly mark and make sure that you can see: Any grab bars or handrails. First and last steps. Where the edge of each step is. Use tools that help you move around (mobility aids) if they are needed. These  include: Canes. Walkers. Scooters. Crutches. Turn on the lights when you go into a dark area. Replace any light bulbs as soon as they burn out. Set up your furniture so you have a clear path. Avoid moving your furniture around. If any of your floors are uneven, fix them. If there are any pets around you, be aware of where they are. Review your medicines with your doctor. Some medicines can make you feel dizzy. This can increase your chance of falling. Ask your doctor what other things that you can do to help prevent falls.  This information is not intended to replace advice given to you by your health care provider. Make sure you discuss any questions you have with your health care provider. Document Released: 06/16/2009 Document Revised: 01/26/2016 Document Reviewed: 09/24/2014 Elsevier Interactive Patient Education  2017 [employer].

## 2022-11-15 NOTE — Progress Notes (Signed)
Subjective:   Molly Carroll is a 67 y.o. female who presents for Medicare Annual (Subsequent) preventive examination. I connected with  TYAN LASYONE on 11/15/22 by a audio enabled telemedicine application and verified that I am speaking with the correct person using two identifiers.  Patient Location: Home  Provider Location: Office/Clinic  I discussed the limitations of evaluation and management by telemedicine. The patient expressed understanding and agreed to proceed.  Cardiac Risk Factors include: advanced age (>68mn, >>28women)     Objective:    There were no vitals filed for this visit. There is no height or weight on file to calculate BMI.   Current Medications (verified) Outpatient Encounter Medications as of 11/15/2022  Medication Sig   SYNTHROID 88 MCG tablet Take 1 tablet (88 mcg total) by mouth daily before breakfast.   rosuvastatin (CRESTOR) 5 MG tablet Take 1 tablet (5 mg total) by mouth daily.   No facility-administered encounter medications on file as of 11/15/2022.    Allergies (verified) Patient has no known allergies.   History: Past Medical History:  Diagnosis Date   COVID-19 05/31/2019   Mixed hyperlipidemia    Other specified hypothyroidism    Past Surgical History:  Procedure Laterality Date   CHOLECYSTECTOMY  1991   HYSTERECTOMY ABDOMINAL WITH SALPINGECTOMY  1993   KNEE SURGERY Left 2005   Family History  Problem Relation Age of Onset   Aortic aneurysm Mother    Prostate cancer Father    Social History   Socioeconomic History   Marital status: Married    Spouse name: Not on file   Number of children: 2   Years of education: Not on file   Highest education level: Not on file  Occupational History   Occupation: Housewife  Tobacco Use   Smoking status: Former    Types: Cigarettes    Quit date: 2007    Years since quitting: 17.2   Smokeless tobacco: Never  Vaping Use   Vaping Use: Never used  Substance and Sexual Activity    Alcohol use: Not Currently   Drug use: Never   Sexual activity: Yes    Partners: Male  Other Topics Concern   Not on file  Social History Narrative   Not on file   Social Determinants of Health   Financial Resource Strain: Low Risk  (11/11/2022)   Overall Financial Resource Strain (CARDIA)    Difficulty of Paying Living Expenses: Not hard at all  Food Insecurity: Not on file  Transportation Needs: No Transportation Needs (11/11/2022)   PRAPARE - THydrologist(Medical): No    Lack of Transportation (Non-Medical): No  Physical Activity: Insufficiently Active (11/11/2022)   Exercise Vital Sign    Days of Exercise per Week: 1 day    Minutes of Exercise per Session: 30 min  Stress: No Stress Concern Present (11/11/2022)   [place]   Feeling of Stress : Not at all  Social Connections: Moderately Integrated (11/11/2022)   Social Connection and Isolation Panel [NHANES]    Frequency of Communication with Friends and Family: More than three times a week    Frequency of Social Gatherings with Friends and Family: Once a week    Attends Religious Services: More than 4 times per year    Active Member of [employer] Organizations: No    Attends CArchivistMeetings: Never    Marital Status: Married  Tobacco Counseling Counseling given: Not Answered   Clinical Intake:  Pre-visit preparation completed: Yes Pain : No/denies pain   BMI - recorded: 30.24 Nutritional Status: BMI > 30  Obese Nutritional Risks: None Diabetes: No How often do you need to have someone help you when you read instructions, pamphlets, or other written materials from your doctor or pharmacy?: 1 - Never Interpreter Needed?: No    Activities of Daily Living    11/11/2022    8:29 PM 03/25/2022    3:07 PM  In your present state of health, do you have any difficulty performing the following activities:  Hearing?  0 0  Vision? 0 0  Difficulty concentrating or making decisions? 0 0  Walking or climbing stairs? 0 0  Dressing or bathing? 0 0  Doing errands, shopping? 0 0  Preparing Food and eating ? N N  Using the Toilet? N N  In the past six months, have you accidently leaked urine? N N  Do you have problems with loss of bowel control? N N  Managing your Medications? N N  Managing your Finances? N N  Housekeeping or managing your Housekeeping? N N    Patient Care Team: Lillard Anes, MD (Inactive) as PCP - General (Family Medicine)     Assessment:   This is a routine wellness examination for [place].  Hearing/Vision screen No results found.  Dietary issues and exercise activities discussed: Current Exercise Habits: The patient does not participate in regular exercise at present, Exercise limited by: None identified   Depression Screen    11/15/2022    1:06 PM 10/12/2022   11:12 AM 04/04/2022   10:23 AM 03/30/2021    8:50 AM 03/30/2020    9:35 AM  PHQ 2/9 Scores  PHQ - 2 Score 0 0 0 0 0    Fall Risk    11/11/2022    8:29 PM 10/12/2022   11:13 AM 10/12/2022    9:59 AM 04/04/2022   10:23 AM 03/25/2022    3:07 PM  [place] in the past year? 0 0 0 0 0  Number falls in past yr: 0 0 0 0   Injury with Fall? 0 0 0 0   Risk for fall due to : No Fall Risks No Fall Risks No Fall Risks No Fall Risks   Follow up Falls evaluation completed;Education provided   Falls prevention discussed     FALL RISK PREVENTION PERTAINING TO THE HOME:  Any stairs in or around the home? No  If so, are there any without handrails? Yes  Home free of loose throw rugs in walkways, pet beds, electrical cords, etc? Yes  Adequate lighting in your home to reduce risk of falls? Yes   ASSISTIVE DEVICES UTILIZED TO PREVENT FALLS:  Life alert? No  Use of a cane, walker or w/c? No  Grab bars in the bathroom? Yes  Shower chair or bench in shower? Yes  Elevated toilet seat or a handicapped toilet? No    Cognitive Function:        11/15/2022    1:10 PM  6CIT Screen  What Year? 0 points  What month? 0 points  What time? 0 points  Count back from 20 0 points  Months in reverse 0 points  Repeat phrase 0 points  Total Score 0 points    Immunizations Immunization History  Administered Date(s) Administered   Influenza,inj,quad, With Preservative 06/21/2017   Influenza-Unspecified 06/22/2021, 06/28/2022   PNEUMOCOCCAL CONJUGATE-20  07/21/2021   Pneumococcal Polysaccharide-23 12/17/2020   Zoster Recombinat (Shingrix) 10/31/2021, 04/24/2022    TDAP status: Due, Education has been provided regarding the importance of this vaccine. Advised may receive this vaccine at local pharmacy or Health Dept. Aware to provide a copy of the vaccination record if obtained from local pharmacy or Health Dept. Verbalized acceptance and understanding.  Flu Vaccine status: Up to date  Pneumococcal vaccine status: Up to date  Covid-19 vaccine status: Declined, Education has been provided regarding the importance of this vaccine but patient still declined. Advised may receive this vaccine at local pharmacy or Health Dept.or vaccine clinic. Aware to provide a copy of the vaccination record if obtained from local pharmacy or Health Dept. Verbalized acceptance and understanding.  Qualifies for Shingles Vaccine? Yes   Zostavax completed No   Shingrix Completed?: Yes  Screening Tests Health Maintenance  Topic Date Due   Hepatitis C Screening  Never done   DTaP/Tdap/Td (1 - Tdap) Never done   COVID-19 Vaccine (1) 10/22/2023 (Originally 06/18/1956)   Medicare Annual Wellness ([place])  11/15/2023   MAMMOGRAM  03/08/2024   COLONOSCOPY (Pts 45-37yr Insurance coverage will need to be confirmed)  05/08/2026   Pneumonia Vaccine 67 Years old  Completed   INFLUENZA VACCINE  Completed   DEXA SCAN  Completed   Zoster Vaccines- Shingrix  Completed   HPV VACCINES  Aged Out    Health Maintenance  Health  Maintenance Due  Topic Date Due   Hepatitis C Screening  Never done   DTaP/Tdap/Td (1 - Tdap) Never done    Colorectal cancer screening: Type of screening: Colonoscopy. Completed 05/08/16. Repeat every 10 years  Mammogram status: Completed 03/08/22. Repeat every year  Bone Density status: Completed  Lung Cancer Screening: (Low Dose CT Chest recommended if Age 67-80years, 30 pack-year currently smoking OR have quit w/in 15years.) does not qualify.   Additional Screening:  Vision Screening: Recommended annual ophthalmology exams for early detection of glaucoma and other disorders of the eye. Is the patient up to date with their annual eye exam?  Yes  Who is the provider or what is the name of the office in which the patient attends annual eye exams? Dr RLeeanne Deedin [place] Recommended annual dental exams for proper oral hygiene  Community Resource Referral / Chronic Care Management: CRR required this visit?  No   CCM required this visit?  No      Plan:    -Tetanus vaccine due -Mammogram and DEXA ordered by GYN  I have personally reviewed and noted the following in the patient's chart:   Medical and social history Use of alcohol, tobacco or illicit drugs  Current medications and supplements including opioid prescriptions. Patient is not currently taking opioid prescriptions. Functional ability and status Nutritional status Physical activity Advanced directives List of other physicians Hospitalizations, surgeries, and ER visits in previous 12 months Vitals Screenings to include cognitive, depression, and falls Referrals and appointments  In addition, I have reviewed and discussed with patient certain preventive protocols, quality metrics, and best practice recommendations. A written personalized care plan for preventive services as well as general preventive health recommendations were provided to patient.     KErie Noe LPN   [id number]

## 2022-12-27 ENCOUNTER — Other Ambulatory Visit: Payer: Self-pay

## 2022-12-27 MED ORDER — SYNTHROID 88 MCG PO TABS: 88.0000 ug | ORAL_TABLET | Freq: Every day | ORAL | 0 refills | Status: DC

## 2023-02-13 ENCOUNTER — Ambulatory Visit: Payer: No Typology Code available for payment source | Admitting: Physician Assistant

## 2023-02-14 ENCOUNTER — Encounter: Payer: Self-pay | Admitting: Physician Assistant

## 2023-02-14 ENCOUNTER — Ambulatory Visit (INDEPENDENT_AMBULATORY_CARE_PROVIDER_SITE_OTHER): Payer: Medicare Other | Admitting: Physician Assistant

## 2023-02-14 VITALS — BP 130/78 | HR 89 | Temp 97.3°F | Ht 62.0 in | Wt 160.2 lb

## 2023-02-14 DIAGNOSIS — E782 Mixed hyperlipidemia: Secondary | ICD-10-CM | POA: Diagnosis not present

## 2023-02-14 DIAGNOSIS — E038 Other specified hypothyroidism: Secondary | ICD-10-CM

## 2023-02-14 DIAGNOSIS — Z1159 Encounter for screening for other viral diseases: Secondary | ICD-10-CM

## 2023-02-14 MED ORDER — MELOXICAM 15 MG PO TABS: 15.0000 mg | ORAL_TABLET | Freq: Every day | ORAL | 3 refills | Status: DC

## 2023-02-14 NOTE — Progress Notes (Signed)
Subjective:  Patient ID: Molly Carroll, female    DOB: April 27, 1956  Age: 67 y.o. MRN: [id number]  Chief Complaint  Patient presents with   Medical Management of Chronic Issues    HPI  Patient is here for her follow up with thyroid. Patient denies taking her Crestor, and states that she would prefer not to take it. Discussed with her why she is not interested in taking it and she said she has a friend who took her cholesterol medicine and BP medicine and still had a major stroke. Discussed with her wanting to try and keep it low, and we will continue to monitor it, but if her cholesterol gets excessively high we would strongly recommend her taking it. She agreed to think about it if it continues to increase.   Denies any side effects or increased symptoms with her synthroid. Patient has good adherence to the medication.   Patient brought her BP cuff here to see if it was accurate because her BP was high last time. It was close to what we got in the office today. Encouraged her to check her BP about 2 times a week to make sure it isn't getting too high. Told her to write down her values and if they continue to be high and let us know so we can see here if its high and potentially start medication.      02/14/2023   10:44 AM 11/15/2022    1:06 PM 10/12/2022   11:12 AM 04/04/2022   10:23 AM 03/30/2021    8:50 AM  Depression screen PHQ 2/9  Decreased Interest 0 0 0 0 0  Down, Depressed, Hopeless 0 0 0 0 0  PHQ - 2 Score 0 0 0 0 0        02/14/2023   10:44 AM  Fall Risk   Falls in the past year? 0  Number falls in past yr: 0  Injury with Fall? 0  Risk for fall due to : No Fall Risks  Follow up Falls evaluation completed    Patient Care Team: Langley Gauss, [place] as PCP - General (Physician Assistant)   Review of Systems  Constitutional:  Negative for fatigue.  HENT:  Negative for congestion, ear pain and sore throat.   Respiratory:  Negative for cough and shortness of breath.    Cardiovascular:  Negative for chest pain.  Gastrointestinal:  Negative for abdominal pain, constipation, diarrhea, nausea and vomiting.  Genitourinary:  Negative for dysuria, frequency and urgency.  Musculoskeletal:  Negative for arthralgias, back pain and myalgias.  Neurological:  Negative for dizziness and headaches.  Psychiatric/Behavioral:  Negative for agitation and sleep disturbance. The patient is not nervous/anxious.     Current Outpatient Medications on File Prior to Visit  Medication Sig Dispense Refill   SYNTHROID 88 MCG tablet Take 1 tablet (88 mcg total) by mouth daily before breakfast. 90 tablet 0   No current facility-administered medications on file prior to visit.   Past Medical History:  Diagnosis Date   COVID-19 05/31/2019   Mixed hyperlipidemia    Other specified hypothyroidism    Past Surgical History:  Procedure Laterality Date   CHOLECYSTECTOMY  1991   HYSTERECTOMY ABDOMINAL WITH SALPINGECTOMY  1993   KNEE SURGERY Left 2005    Family History  Problem Relation Age of Onset   Aortic aneurysm Mother    Prostate cancer Father    Social History   Socioeconomic History   Marital status: Married  Spouse name: Not on file   Number of children: 2   Years of education: Not on file   Highest education level: Not on file  Occupational History   Occupation: Housewife  Tobacco Use   Smoking status: Former    Types: Cigarettes    Quit date: 2007    Years since quitting: 17.4   Smokeless tobacco: Never  Vaping Use   Vaping Use: Never used  Substance and Sexual Activity   Alcohol use: Not Currently   Drug use: Never   Sexual activity: Yes    Partners: Male  Other Topics Concern   Not on file  Social History Narrative   Not on file   Social Determinants of Health   Financial Resource Strain: Low Risk  (11/11/2022)   Overall Financial Resource Strain (CARDIA)    Difficulty of Paying Living Expenses: Not hard at all  Food Insecurity: Not on file   Transportation Needs: No Transportation Needs (11/11/2022)   PRAPARE - Administrator, Civil Service (Medical): No    Lack of Transportation (Non-Medical): No  Physical Activity: Insufficiently Active (11/11/2022)   Exercise Vital Sign    Days of Exercise per Week: 1 day    Minutes of Exercise per Session: 30 min  Stress: No Stress Concern Present (11/11/2022)   [employer] of Occupational Health - Occupational Stress Questionnaire    Feeling of Stress : Not at all  Social Connections: Moderately Integrated (11/11/2022)   Social Connection and Isolation Panel [NHANES]    Frequency of Communication with Friends and Family: More than three times a week    Frequency of Social Gatherings with Friends and Family: Once a week    Attends Religious Services: More than 4 times per year    Active Member of [employer] or Organizations: No    Attends Engineer, structural: Never    Marital Status: Married    Objective:  BP 130/78 (BP Location: Left Arm, Patient Position: Sitting, Cuff Size: Large)   Pulse 89   Temp (!) 97.3 F (36.3 C) (Temporal)   Ht 5\' 2"  (1.575 m)   Wt 160 lb 3.2 oz (72.7 kg)   SpO2 94%   BMI 29.30 kg/m      02/14/2023   10:42 AM 10/12/2022   10:25 AM 10/12/2022    9:54 AM  BP/Weight  Systolic BP 130 140 140  Diastolic BP 78 80 70  Wt. (Lbs) 160.2  165.4  BMI 29.3 kg/m2  30.25 kg/m2    Physical Exam Vitals reviewed.  Constitutional:      Appearance: Normal appearance.  Cardiovascular:     Rate and Rhythm: Normal rate and regular rhythm.     Heart sounds: Normal heart sounds.  Pulmonary:     Effort: Pulmonary effort is normal.     Breath sounds: Normal breath sounds.  Abdominal:     General: Bowel sounds are normal.     Palpations: Abdomen is soft.     Tenderness: There is no abdominal tenderness.  Neurological:     Mental Status: She is alert and oriented to person, place, and time.  Psychiatric:        Mood and Affect: Mood normal.         Behavior: Behavior normal.     Diabetic Foot Exam - Simple   No data filed      Lab Results  Component Value Date   WBC 6.6 10/12/2022   HGB 12.8 10/12/2022   HCT  39.9 10/12/2022   PLT 293 10/12/2022   GLUCOSE 75 10/12/2022   CHOL 228 (H) 10/12/2022   TRIG 122 10/12/2022   HDL 53 10/12/2022   LDLCALC 153 (H) 10/12/2022   ALT 24 10/12/2022   AST 30 10/12/2022   NA 141 10/12/2022   K 5.3 (H) 10/12/2022   CL 103 10/12/2022   CREATININE 0.77 10/12/2022   BUN 15 10/12/2022   CO2 26 10/12/2022   TSH 3.470 10/12/2022   HGBA1C 5.6 10/12/2022      Assessment & Plan:    Other specified hypothyroidism Assessment & Plan: Well controlled.  Continue to work on eating a healthy diet and exercise.  Labs drawn today.   No major side effects reported, and no issues with compliance. The current medical regimen is effective;  continue present plan with Synthroid 88mg  Will adjust medication as needed depending on labs    Orders: -     TSH  Mixed hyperlipidemia Assessment & Plan: Uncontrolled  Patient refuses to begin medication until it gets excessively high Discussed concerns taking the medication Will consider taking if it continues to get worse.  Lab Results  Component Value Date   CHOL 228 (H) 10/12/2022   CHOL 216 (H) 04/04/2022   CHOL 234 (H) 10/05/2021   Lab Results  Component Value Date   HDL 53 10/12/2022   HDL 51 04/04/2022   HDL 51 10/05/2021   Lab Results  Component Value Date   LDLCALC 153 (H) 10/12/2022   LDLCALC 143 (H) 04/04/2022   LDLCALC 159 (H) 10/05/2021   Lab Results  Component Value Date   TRIG 122 10/12/2022   TRIG 123 04/04/2022   TRIG 134 10/05/2021   Lab Results  Component Value Date   CHOLHDL 4.3 10/12/2022   CHOLHDL 4.2 04/04/2022   CHOLHDL 4.6 (H) 10/05/2021     Orders: -     CBC with Differential/Platelet -     CMP14+EGFR -     Lipid panel  Need for hepatitis C screening test -     Hepatitis C  antibody  Other orders -     Meloxicam; Take 1 tablet (15 mg total) by mouth daily.  Dispense: 30 tablet; Refill: 3     Meds ordered this encounter  Medications   meloxicam (MOBIC) 15 MG tablet    Sig: Take 1 tablet (15 mg total) by mouth daily.    Dispense:  30 tablet    Refill:  3    Orders Placed This Encounter  Procedures   CBC with Differential/Platelet   TSH   CMP14+EGFR   Lipid panel   Hepatitis C antibody     Follow-up: Return in about 6 months (around 08/16/2023) for [facility], Chronic.   I,Jacqua L Marsh,acting as a scribe for [employer], PA.,have documented all relevant documentation on the behalf of Langley Gauss, PA,as directed by  Langley Gauss, PA while in the presence of Langley Gauss, [place].   An After Visit Summary was printed and given to the patient.  Langley Gauss, [place] [facility] [phone]

## 2023-02-14 NOTE — Assessment & Plan Note (Signed)
Well controlled.  Continue to work on eating a healthy diet and exercise.  Labs drawn today.   No major side effects reported, and no issues with compliance. The current medical regimen is effective;  continue present plan with Synthroid 88mg  Will adjust medication as needed depending on labs

## 2023-02-14 NOTE — Assessment & Plan Note (Addendum)
Uncontrolled  Patient refuses to begin medication until it gets excessively high Discussed concerns taking the medication Will consider taking if it continues to get worse.  Lab Results  Component Value Date   CHOL 228 (H) 10/12/2022   CHOL 216 (H) 04/04/2022   CHOL 234 (H) 10/05/2021   Lab Results  Component Value Date   HDL 53 10/12/2022   HDL 51 04/04/2022   HDL 51 10/05/2021   Lab Results  Component Value Date   LDLCALC 153 (H) 10/12/2022   LDLCALC 143 (H) 04/04/2022   LDLCALC 159 (H) 10/05/2021   Lab Results  Component Value Date   TRIG 122 10/12/2022   TRIG 123 04/04/2022   TRIG 134 10/05/2021   Lab Results  Component Value Date   CHOLHDL 4.3 10/12/2022   CHOLHDL 4.2 04/04/2022   CHOLHDL 4.6 (H) 10/05/2021

## 2023-02-15 LAB — CMP14+EGFR
ALT: 20 IU/L (ref 0–32)
AST: 27 IU/L (ref 0–40)
Albumin/Globulin Ratio: 1.7
Albumin: 4.6 g/dL (ref 3.9–4.9)
Alkaline Phosphatase: 77 IU/L (ref 44–121)
BUN/Creatinine Ratio: 20 (ref 12–28)
BUN: 17 mg/dL (ref 8–27)
Bilirubin Total: 0.3 mg/dL (ref 0.0–1.2)
CO2: 26 mmol/L (ref 20–29)
Calcium: 9.7 mg/dL (ref 8.7–10.3)
Chloride: 101 mmol/L (ref 96–106)
Creatinine, Ser: 0.85 mg/dL (ref 0.57–1.00)
Globulin, Total: 2.7 g/dL (ref 1.5–4.5)
Glucose: 95 mg/dL (ref 70–99)
Potassium: 5.1 mmol/L (ref 3.5–5.2)
Sodium: 141 mmol/L (ref 134–144)
Total Protein: 7.3 g/dL (ref 6.0–8.5)
eGFR: 75 mL/min/{1.73_m2} (ref >59)

## 2023-02-15 LAB — CBC WITH DIFFERENTIAL/PLATELET
Basophils Absolute: 0.1 10*3/uL (ref 0.0–0.2)
Basos: 1 %
EOS (ABSOLUTE): 0.1 10*3/uL (ref 0.0–0.4)
Eos: 1 %
Hematocrit: 40.3 % (ref 34.0–46.6)
Hemoglobin: 13.3 g/dL (ref 11.1–15.9)
Immature Grans (Abs): 0.1 10*3/uL (ref 0.0–0.1)
Immature Granulocytes: 1 %
Lymphocytes Absolute: 3.3 10*3/uL — ABNORMAL HIGH (ref 0.7–3.1)
Lymphs: 44 %
MCH: 29.5 pg (ref 26.6–33.0)
MCHC: 33 g/dL (ref 31.5–35.7)
MCV: 89 fL (ref 79–97)
Monocytes Absolute: 0.7 10*3/uL (ref 0.1–0.9)
Monocytes: 9 %
Neutrophils Absolute: 3.4 10*3/uL (ref 1.4–7.0)
Neutrophils: 44 %
Platelets: 345 10*3/uL (ref 150–450)
RBC: 4.51 x10E6/uL (ref 3.77–5.28)
RDW: 13.1 % (ref 11.7–15.4)
WBC: 7.5 10*3/uL (ref 3.4–10.8)

## 2023-02-15 LAB — LIPID PANEL
Chol/HDL Ratio: 4.3 ratio (ref 0.0–4.4)
Cholesterol, Total: 232 mg/dL — ABNORMAL HIGH (ref 100–199)
HDL: 54 mg/dL (ref >39)
LDL Chol Calc (NIH): 160 mg/dL — ABNORMAL HIGH (ref 0–99)
Triglycerides: 99 mg/dL (ref 0–149)
VLDL Cholesterol Cal: 18 mg/dL (ref 5–40)

## 2023-02-15 LAB — TSH: TSH: 2.48 u[IU]/mL (ref 0.450–4.500)

## 2023-02-15 LAB — HEPATITIS C ANTIBODY: Hep C Virus Ab: NONREACTIVE

## 2023-04-05 LAB — HM MAMMOGRAPHY

## 2023-08-08 ENCOUNTER — Ambulatory Visit (INDEPENDENT_AMBULATORY_CARE_PROVIDER_SITE_OTHER): Payer: Medicare Other | Admitting: Physician Assistant

## 2023-08-08 ENCOUNTER — Encounter: Payer: Self-pay | Admitting: Physician Assistant

## 2023-08-08 VITALS — BP 124/68 | HR 88 | Temp 98.0°F | Ht 62.0 in | Wt 165.0 lb

## 2023-08-08 DIAGNOSIS — E782 Mixed hyperlipidemia: Secondary | ICD-10-CM | POA: Diagnosis not present

## 2023-08-08 DIAGNOSIS — E038 Other specified hypothyroidism: Secondary | ICD-10-CM

## 2023-08-08 DIAGNOSIS — R030 Elevated blood-pressure reading, without diagnosis of hypertension: Secondary | ICD-10-CM

## 2023-08-08 MED ORDER — PRAVASTATIN SODIUM 20 MG PO TABS: 20.0000 mg | ORAL_TABLET | Freq: Every day | ORAL | 0 refills | Status: DC

## 2023-08-08 NOTE — Assessment & Plan Note (Signed)
LDL elevated at 163, with a goal of less than 100. Discussed the risks/benefits of starting a statin medication to reduce the risk of stroke and plaque buildup. Patient agreed to start medication. -Start low-dose statin medication. -Check cholesterol levels after starting medication.

## 2023-08-08 NOTE — Assessment & Plan Note (Signed)
BP normal today Denies any symptoms of high BP  Will continue to monitor for symptoms

## 2023-08-08 NOTE — Progress Notes (Signed)
Subjective:  Patient ID: Molly Carroll, female    DOB: 1956/02/06  Age: 67 y.o. MRN: [id number]  Chief Complaint  Patient presents with   Medical Management of Chronic Issues    HPI Discussed the use of AI scribe software for clinical note transcription with the patient, who gave verbal consent to proceed.  History of Present Illness   A 67 year old female with a history of thyroid disease and high cholesterol presents for a routine follow-up. She reports having caught a cold from her husband, which has resulted in a dry cough and nasal drainage, particularly at night. She has been managing her symptoms with over-the-counter Dayquil and Nyquil. She denies any fever, sinus pain, or changes in bowel movements.  The patient also discusses her recent weight gain and acknowledges that her cholesterol levels may be elevated as a result. She expresses a commitment to improving her diet and exercise habits after the holiday season. She agreed to starting on medication today as long as it is not one she had a few years ago that she had a lot of issues with.   In addition to her cold and cholesterol concerns, the patient is on meloxicam for general aches and pains, which she describes as not too severe. She is also on Synthroid for a thyroid condition, which appears to be well-managed. She expresses a preference for the brand-name medication due to past difficulties with adjusting her dosage.            08/08/2023    9:27 AM 02/14/2023   10:44 AM 11/15/2022    1:06 PM 10/12/2022   11:12 AM 04/04/2022   10:23 AM  Depression screen PHQ 2/9  Decreased Interest 0 0 0 0 0  Down, Depressed, Hopeless 0 0 0 0 0  PHQ - 2 Score 0 0 0 0 0  Altered sleeping 0      Tired, decreased energy 0      Change in appetite 0      Feeling bad or failure about yourself  0      Trouble concentrating 0      Moving slowly or fidgety/restless 0      Suicidal thoughts 0      PHQ-9 Score 0      Difficult doing  work/chores Not difficult at all            08/08/2023    9:27 AM  Fall Risk   Falls in the past year? 0  Number falls in past yr: 0  Injury with Fall? 0  Risk for fall due to : No Fall Risks  Follow up Falls evaluation completed    Patient Care Team: Langley Gauss, [place] as PCP - General (Physician Assistant)   Review of Systems  Constitutional:  Negative for chills, fatigue and fever.  HENT:  Positive for rhinorrhea. Negative for congestion, ear pain, sinus pressure, sinus pain and sore throat.   Respiratory:  Positive for cough. Negative for shortness of breath.   Cardiovascular:  Negative for chest pain and palpitations.  Gastrointestinal:  Negative for abdominal pain, constipation, diarrhea, nausea and vomiting.  Genitourinary:  Negative for difficulty urinating and dysuria.  Musculoskeletal:  Negative for arthralgias, back pain and myalgias.  Skin:  Negative for rash.  Neurological:  Negative for dizziness and headaches.  Psychiatric/Behavioral:  Negative for dysphoric mood.     Current Outpatient Medications on File Prior to Visit  Medication Sig Dispense Refill   meloxicam (MOBIC) 15 MG tablet  Take 1 tablet (15 mg total) by mouth daily. 30 tablet 3   SYNTHROID 88 MCG tablet Take 1 tablet (88 mcg total) by mouth daily before breakfast. 90 tablet 0   No current facility-administered medications on file prior to visit.   Past Medical History:  Diagnosis Date   COVID-19 05/31/2019   Mixed hyperlipidemia    Other specified hypothyroidism    Past Surgical History:  Procedure Laterality Date   CHOLECYSTECTOMY  1991   HYSTERECTOMY ABDOMINAL WITH SALPINGECTOMY  1993   KNEE SURGERY Left 2005    Family History  Problem Relation Age of Onset   Aortic aneurysm Mother    Prostate cancer Father    Social History   Socioeconomic History   Marital status: Married    Spouse name: Not on file   Number of children: 2   Years of education: Not on file   Highest education  level: 12th grade  Occupational History   Occupation: Housewife  Tobacco Use   Smoking status: Former    Current packs/day: 0.00    Types: Cigarettes    Quit date: 2007    Years since quitting: 17.9   Smokeless tobacco: Never  Vaping Use   Vaping status: Never Used  Substance and Sexual Activity   Alcohol use: Not Currently   Drug use: Never   Sexual activity: Yes    Partners: Male  Other Topics Concern   Not on file  Social History Narrative   Not on file   Social Determinants of Health   Financial Resource Strain: Low Risk  (08/07/2023)   Overall Financial Resource Strain (CARDIA)    Difficulty of Paying Living Expenses: Not hard at all  Food Insecurity: No Food Insecurity (08/07/2023)   Hunger Vital Sign    Worried About Running Out of Food in the Last Year: Never true    Ran Out of Food in the Last Year: Never true  Transportation Needs: No Transportation Needs (08/07/2023)   PRAPARE - Administrator, Civil Service (Medical): No    Lack of Transportation (Non-Medical): No  Physical Activity: Unknown (08/07/2023)   Exercise Vital Sign    Days of Exercise per Week: 1 day    Minutes of Exercise per Session: Patient declined  Stress: No Stress Concern Present (08/07/2023)   [employer] of Occupational Health - Occupational Stress Questionnaire    Feeling of Stress : Not at all  Social Connections: Moderately Integrated (08/07/2023)   Social Connection and Isolation Panel [NHANES]    Frequency of Communication with Friends and Family: Three times a week    Frequency of Social Gatherings with Friends and Family: Once a week    Attends Religious Services: More than 4 times per year    Active Member of [employer] or Organizations: No    Attends Engineer, structural: Never    Marital Status: Married    Objective:  BP 124/68   Pulse 88   Temp 98 F (36.7 C)   Ht 5\' 2"  (1.575 m)   Wt 165 lb (74.8 kg)   SpO2 95%   BMI 30.18 kg/m      08/08/2023     9:21 AM 02/14/2023   10:42 AM 10/12/2022   10:25 AM  BP/Weight  Systolic BP 124 130 140  Diastolic BP 68 78 80  Wt. (Lbs) 165 160.2   BMI 30.18 kg/m2 29.3 kg/m2     Physical Exam Vitals reviewed.  Constitutional:  Appearance: Normal appearance.  HENT:     Nose: Rhinorrhea present. No congestion.     Mouth/Throat:     Mouth: Mucous membranes are moist.     Pharynx: Oropharynx is clear.  Cardiovascular:     Rate and Rhythm: Normal rate and regular rhythm.     Heart sounds: Normal heart sounds.  Pulmonary:     Effort: Pulmonary effort is normal.     Breath sounds: Normal breath sounds.  Abdominal:     General: Bowel sounds are normal.     Palpations: Abdomen is soft.     Tenderness: There is no abdominal tenderness.  Neurological:     Mental Status: She is alert and oriented to person, place, and time.  Psychiatric:        Mood and Affect: Mood normal.        Behavior: Behavior normal.     Diabetic Foot Exam - Simple   No data filed      Lab Results  Component Value Date   WBC 7.5 02/14/2023   HGB 13.3 02/14/2023   HCT 40.3 02/14/2023   PLT 345 02/14/2023   GLUCOSE 95 02/14/2023   CHOL 232 (H) 02/14/2023   TRIG 99 02/14/2023   HDL 54 02/14/2023   LDLCALC 160 (H) 02/14/2023   ALT 20 02/14/2023   AST 27 02/14/2023   NA 141 02/14/2023   K 5.1 02/14/2023   CL 101 02/14/2023   CREATININE 0.85 02/14/2023   BUN 17 02/14/2023   CO2 26 02/14/2023   TSH 2.480 02/14/2023   HGBA1C 5.6 10/12/2022      Assessment & Plan:    Mixed hyperlipidemia Assessment & Plan: LDL elevated at 163, with a goal of less than 100. Discussed the risks/benefits of starting a statin medication to reduce the risk of stroke and plaque buildup. Patient agreed to start medication. -Start low-dose statin medication. -Check cholesterol levels after starting medication.  Orders: -     CBC with Differential/Platelet -     Comprehensive metabolic panel -     Lipid panel -      Pravastatin Sodium; Take 1 tablet (20 mg total) by mouth daily.  Dispense: 30 tablet; Refill: 0  Other specified hypothyroidism Assessment & Plan: Stable on Synthroid. No recent changes in dose or symptoms of hyper- or hypothyroidism. -Continue Synthroid at current dose. -Check thyroid function tests today.  Orders: -     T4, free -     TSH  Elevated blood-pressure reading without diagnosis of hypertension Assessment & Plan: BP normal today Denies any symptoms of high BP  Will continue to monitor for symptoms  Orders: -     CBC with Differential/Platelet -     Comprehensive metabolic panel     Meds ordered this encounter  Medications   pravastatin (PRAVACHOL) 20 MG tablet    Sig: Take 1 tablet (20 mg total) by mouth daily.    Dispense:  30 tablet    Refill:  0    Orders Placed This Encounter  Procedures   CBC with Differential/Platelet   Comprehensive metabolic panel   Lipid panel   T4, free   TSH    General Health Maintenance -Flu vaccine already received in October 2024. -Discussed tetanus and pertussis vaccine, patient deferred to next visit.            Follow-up: No follow-ups on file.   Madelynn Done Smith,acting as a Neurosurgeon for [employer], PA.,have documented all relevant documentation on the  behalf of Langley Gauss, PA,as directed by  Langley Gauss, PA while in the presence of Langley Gauss, [place].   An After Visit Summary was printed and given to the patient.  Langley Gauss, [place] [facility] [phone]

## 2023-08-08 NOTE — Assessment & Plan Note (Signed)
Stable on Synthroid. No recent changes in dose or symptoms of hyper- or hypothyroidism. -Continue Synthroid at current dose. -Check thyroid function tests today.

## 2023-08-09 LAB — CBC WITH DIFFERENTIAL/PLATELET
Basophils Absolute: 0 10*3/uL (ref 0.0–0.2)
Basos: 1 %
EOS (ABSOLUTE): 0.1 10*3/uL (ref 0.0–0.4)
Eos: 1 %
Hematocrit: 40.9 % (ref 34.0–46.6)
Hemoglobin: 13.4 g/dL (ref 11.1–15.9)
Immature Grans (Abs): 0 10*3/uL (ref 0.0–0.1)
Immature Granulocytes: 0 %
Lymphocytes Absolute: 2.6 10*3/uL (ref 0.7–3.1)
Lymphs: 33 %
MCH: 30 pg (ref 26.6–33.0)
MCHC: 32.8 g/dL (ref 31.5–35.7)
MCV: 92 fL (ref 79–97)
Monocytes Absolute: 0.9 10*3/uL (ref 0.1–0.9)
Monocytes: 11 %
Neutrophils Absolute: 4.2 10*3/uL (ref 1.4–7.0)
Neutrophils: 54 %
Platelets: 310 10*3/uL (ref 150–450)
RBC: 4.46 x10E6/uL (ref 3.77–5.28)
RDW: 12.5 % (ref 11.7–15.4)
WBC: 7.8 10*3/uL (ref 3.4–10.8)

## 2023-08-09 LAB — LIPID PANEL
Chol/HDL Ratio: 4.2 {ratio} (ref 0.0–4.4)
Cholesterol, Total: 217 mg/dL — ABNORMAL HIGH (ref 100–199)
HDL: 52 mg/dL (ref >39)
LDL Chol Calc (NIH): 138 mg/dL — ABNORMAL HIGH (ref 0–99)
Triglycerides: 152 mg/dL — ABNORMAL HIGH (ref 0–149)
VLDL Cholesterol Cal: 27 mg/dL (ref 5–40)

## 2023-08-09 LAB — COMPREHENSIVE METABOLIC PANEL
ALT: 20 [IU]/L (ref 0–32)
AST: 25 [IU]/L (ref 0–40)
Albumin: 4.5 g/dL (ref 3.9–4.9)
Alkaline Phosphatase: 77 [IU]/L (ref 44–121)
BUN/Creatinine Ratio: 23 (ref 12–28)
BUN: 19 mg/dL (ref 8–27)
Bilirubin Total: 0.3 mg/dL (ref 0.0–1.2)
CO2: 24 mmol/L (ref 20–29)
Calcium: 9.7 mg/dL (ref 8.7–10.3)
Chloride: 102 mmol/L (ref 96–106)
Creatinine, Ser: 0.84 mg/dL (ref 0.57–1.00)
Globulin, Total: 2.6 g/dL (ref 1.5–4.5)
Glucose: 96 mg/dL (ref 70–99)
Potassium: 4.8 mmol/L (ref 3.5–5.2)
Sodium: 143 mmol/L (ref 134–144)
Total Protein: 7.1 g/dL (ref 6.0–8.5)
eGFR: 76 mL/min/{1.73_m2} (ref >59)

## 2023-08-09 LAB — TSH: TSH: 4.55 u[IU]/mL — ABNORMAL HIGH (ref 0.450–4.500)

## 2023-08-09 LAB — T4, FREE: Free T4: 1.2 ng/dL (ref 0.82–1.77)

## 2023-08-13 ENCOUNTER — Other Ambulatory Visit: Payer: Self-pay

## 2023-08-13 DIAGNOSIS — E038 Other specified hypothyroidism: Secondary | ICD-10-CM

## 2023-08-13 MED ORDER — LEVOTHYROXINE SODIUM 100 MCG PO TABS: 100.0000 ug | ORAL_TABLET | Freq: Every day | ORAL | 0 refills | Status: DC

## 2023-08-21 ENCOUNTER — Ambulatory Visit: Payer: No Typology Code available for payment source | Admitting: Physician Assistant

## 2023-08-22 ENCOUNTER — Other Ambulatory Visit: Payer: Self-pay

## 2023-08-22 DIAGNOSIS — E038 Other specified hypothyroidism: Secondary | ICD-10-CM

## 2023-08-22 DIAGNOSIS — E782 Mixed hyperlipidemia: Secondary | ICD-10-CM

## 2023-08-23 MED ORDER — LEVOTHYROXINE SODIUM 100 MCG PO TABS: 100.0000 ug | ORAL_TABLET | Freq: Every day | ORAL | 2 refills | Status: DC

## 2023-08-23 MED ORDER — PRAVASTATIN SODIUM 20 MG PO TABS: 20.0000 mg | ORAL_TABLET | Freq: Every day | ORAL | 2 refills | Status: DC

## 2023-08-24 ENCOUNTER — Other Ambulatory Visit: Payer: Self-pay | Admitting: Physician Assistant

## 2023-09-02 ENCOUNTER — Other Ambulatory Visit: Payer: Self-pay

## 2023-09-02 DIAGNOSIS — E038 Other specified hypothyroidism: Secondary | ICD-10-CM

## 2023-09-02 DIAGNOSIS — E782 Mixed hyperlipidemia: Secondary | ICD-10-CM

## 2023-09-02 MED ORDER — LEVOTHYROXINE SODIUM 100 MCG PO TABS: 100.0000 ug | ORAL_TABLET | Freq: Every day | ORAL | 0 refills | Status: DC

## 2023-09-02 MED ORDER — PRAVASTATIN SODIUM 20 MG PO TABS: 20.0000 mg | ORAL_TABLET | Freq: Every day | ORAL | 0 refills | Status: DC

## 2023-09-02 NOTE — Telephone Encounter (Signed)
Copied from CRM [id number]. Topic: Clinical - Medication Question >> Sep 02, 2023 11:05 AM Donita Brooks wrote: Reason for CRM: pt wants to know why her medication  pravastatin (PRAVACHOL) 20 MG tablet is only a 30 day supply, pt is stating that she will run out. - [phone]

## 2023-09-17 ENCOUNTER — Ambulatory Visit: Payer: Medicare Other

## 2023-09-17 DIAGNOSIS — E038 Other specified hypothyroidism: Secondary | ICD-10-CM

## 2023-09-18 LAB — T4, FREE: Free T4: 1.28 ng/dL (ref 0.82–1.77)

## 2023-09-18 LAB — TSH: TSH: 3.6 u[IU]/mL (ref 0.450–4.500)

## 2023-09-25 ENCOUNTER — Other Ambulatory Visit: Payer: Self-pay | Admitting: Physician Assistant

## 2023-09-26 ENCOUNTER — Other Ambulatory Visit: Payer: Self-pay | Admitting: Physician Assistant

## 2023-09-26 MED ORDER — MELOXICAM 15 MG PO TABS: 15.0000 mg | ORAL_TABLET | Freq: Every day | ORAL | 0 refills | Status: DC

## 2023-09-26 NOTE — Telephone Encounter (Signed)
Copied from CRM [id number]. Topic: Clinical - Medication Refill >> Sep 26, 2023  8:43 AM Gildardo Pounds wrote: Most Recent Primary Care Visit:  [facility]  Department[facility]  Visit Type: CLINICAL SUPPORT  Date: 09/17/2023  Medication: meloxicam (MOBIC) 15 MG tablet  Has the patient contacted their pharmacy? Yes (Agent: If no, request that the patient contact the pharmacy for the refill. If patient does not wish to contact the pharmacy document the reason why and proceed with request.) (Agent: If yes, when and what did the pharmacy advise?)  Is this the correct pharmacy for this prescription? Yes If no, delete pharmacy and type the correct one.  This is the patient's preferred pharmacy:  [facility] [address] [place], [place] - [address] [address] [facility] [address] [place] [address] Phone: [phone] Fax: [phone]     Has the prescription been filled recently? No  Is the patient out of the medication? No  Has the patient been seen for an appointment in the last year OR does the patient have an upcoming appointment? Yes  Can we respond through MyChart? No  Agent: Please be advised that Rx refills may take up to 3 business days. We ask that you follow-up with your pharmacy.

## 2023-10-10 ENCOUNTER — Other Ambulatory Visit: Payer: Self-pay | Admitting: Physician Assistant

## 2023-10-10 DIAGNOSIS — E782 Mixed hyperlipidemia: Secondary | ICD-10-CM

## 2023-10-10 MED ORDER — PRAVASTATIN SODIUM 20 MG PO TABS: 20.0000 mg | ORAL_TABLET | Freq: Every day | ORAL | 0 refills | Status: DC

## 2023-10-10 NOTE — Telephone Encounter (Signed)
 Copied from CRM [id number]. Topic: Clinical - Medication Refill >> Oct 10, 2023  8:09 AM Montie POUR wrote: Most Recent Primary Care Visit:  Provider[facility]  Department[facility]  Visit Type: CLINICAL SUPPORT  Date: 09/17/2023  Medication: pravastatin  (PRAVACHOL ) 20 MG tablet   Has the patient contacted their pharmacy? No (Agent: If no, request that the patient contact the pharmacy for the refill. If patient does not wish to contact the pharmacy document the reason why and proceed with request.) (Agent: If yes, when and what did the pharmacy advise?)  Is this the correct pharmacy for this prescription? Yes - [facility] If no, delete pharmacy and type the correct one.  This is the patient's preferred pharmacy:  [facility] [address] [place], [place] - [address] [address] [facility] [address] [place] [address] Phone: [phone] Fax: [phone]    Has the prescription been filled recently? No  Is the patient out of the medication? No She has 4 left  Has the patient been seen for an appointment in the last year OR does the patient have an upcoming appointment? Yes  Can we respond through MyChart? No  Agent: Please be advised that Rx refills may take up to 3 business days. We ask that you follow-up with your pharmacy.

## 2023-12-19 ENCOUNTER — Ambulatory Visit (INDEPENDENT_AMBULATORY_CARE_PROVIDER_SITE_OTHER)

## 2023-12-19 VITALS — Ht 62.0 in | Wt 165.0 lb

## 2023-12-19 DIAGNOSIS — Z0000 Encounter for general adult medical examination without abnormal findings: Secondary | ICD-10-CM | POA: Diagnosis not present

## 2023-12-19 NOTE — Progress Notes (Signed)
 Subjective:   Molly Carroll is a 68 y.o. who presents for a Medicare Wellness preventive visit.  Visit Complete: Virtual I connected with  Molly Carroll on 12/19/23 by a audio enabled telemedicine application and verified that I am speaking with the correct person using two identifiers.  Patient Location: Home  Provider Location: Home Office  I discussed the limitations of evaluation and management by telemedicine. The patient expressed understanding and agreed to proceed.  Vital Signs: Because this visit was a virtual/telehealth visit, some criteria may be missing or patient reported. Any vitals not documented were not able to be obtained and vitals that have been documented are patient reported.  VideoDeclined- This patient declined Librarian, academic. Therefore the visit was completed with audio only.  Persons Participating in Visit: Patient.  AWV Questionnaire: No: Patient Medicare AWV questionnaire was not completed prior to this visit.  Cardiac Risk Factors include: advanced age (>25men, >43 women);dyslipidemia     Objective:    Today's Vitals   12/19/23 1136  Weight: 165 lb (74.8 kg)  Height: 5\' 2"  (1.575 m)   Body mass index is 30.18 kg/m.     12/19/2023   11:41 AM  Advanced Directives  Does Patient Have a Medical Advance Directive? No  Would patient like information on creating a medical advance directive? Yes (MAU/Ambulatory/Procedural Areas - Information given)    Current Medications (verified) Outpatient Encounter Medications as of 12/19/2023  Medication Sig   levothyroxine (SYNTHROID) 100 MCG tablet Take 1 tablet (100 mcg total) by mouth daily.   meloxicam (MOBIC) 15 MG tablet Take 1 tablet (15 mg total) by mouth daily.   pravastatin (PRAVACHOL) 20 MG tablet Take 1 tablet (20 mg total) by mouth daily.   No facility-administered encounter medications on file as of 12/19/2023.    Allergies (verified) Patient has no known  allergies.   History: Past Medical History:  Diagnosis Date   COVID-19 05/31/2019   Mixed hyperlipidemia    Other specified hypothyroidism    Past Surgical History:  Procedure Laterality Date   CHOLECYSTECTOMY  1991   HYSTERECTOMY ABDOMINAL WITH SALPINGECTOMY  1993   KNEE SURGERY Left 2005   Family History  Problem Relation Age of Onset   Aortic aneurysm Mother    Prostate cancer Father    Social History   Socioeconomic History   Marital status: Married    Spouse name: Not on file   Number of children: 2   Years of education: Not on file   Highest education level: 12th grade  Occupational History   Occupation: Housewife  Tobacco Use   Smoking status: Former    Current packs/day: 0.00    Types: Cigarettes    Quit date: 2007    Years since quitting: 18.3   Smokeless tobacco: Never  Vaping Use   Vaping status: Never Used  Substance and Sexual Activity   Alcohol use: Not Currently   Drug use: Never   Sexual activity: Yes    Partners: Male  Other Topics Concern   Not on file  Social History Narrative   Not on file   Social Drivers of Health   Financial Resource Strain: Low Risk  (12/19/2023)   Overall Financial Resource Strain (CARDIA)    Difficulty of Paying Living Expenses: Not hard at all  Food Insecurity: No Food Insecurity (12/19/2023)   Hunger Vital Sign    Worried About Running Out of Food in the Last Year: Never true    Ran  Out of Food in the Last Year: Never true  Transportation Needs: No Transportation Needs (12/19/2023)   PRAPARE - Administrator, Civil Service (Medical): No    Lack of Transportation (Non-Medical): No  Physical Activity: Insufficiently Active (12/19/2023)   Exercise Vital Sign    Days of Exercise per Week: 3 days    Minutes of Exercise per Session: 30 min  Stress: No Stress Concern Present (12/19/2023)   [employer] of Occupational Health - Occupational Stress Questionnaire    Feeling of Stress : Not at all  Social  Connections: Moderately Integrated (12/19/2023)   Social Connection and Isolation Panel [NHANES]    Frequency of Communication with Friends and Family: Three times a week    Frequency of Social Gatherings with Friends and Family: Once a week    Attends Religious Services: More than 4 times per year    Active Member of [employer] or Organizations: No    Attends Banker Meetings: Never    Marital Status: Married    Tobacco Counseling Counseling given: Not Answered    Clinical Intake:  Pre-visit preparation completed: Yes  Pain : No/denies pain     Diabetes: No  Lab Results  Component Value Date   HGBA1C 5.6 10/12/2022     How often do you need to have someone help you when you read instructions, pamphlets, or other written materials from your doctor or pharmacy?: 1 - Never  Interpreter Needed?: No  Information entered by :: Kandis Fantasia LPN   Activities of Daily Living     12/19/2023   11:41 AM  In your present state of health, do you have any difficulty performing the following activities:  Hearing? 0  Vision? 0  Difficulty concentrating or making decisions? 0  Walking or climbing stairs? 0  Dressing or bathing? 0  Doing errands, shopping? 0  Preparing Food and eating ? N  Using the Toilet? N  In the past six months, have you accidently leaked urine? N  Do you have problems with loss of bowel control? N  Managing your Medications? N  Managing your Finances? N  Housekeeping or managing your Housekeeping? N    Patient Care Team: Langley Gauss, [place] as PCP - General (Physician Assistant)  Indicate any recent Medical Services you may have received from other than [facility] providers in the past year (date may be approximate).     Assessment:   This is a routine wellness examination for [place].  Hearing/Vision screen Hearing Screening - Comments:: Denies hearing difficulties   Vision Screening - Comments:: No vision problems; will schedule routine eye  exam soon     Goals Addressed             This Visit's Progress    Remain active and independent         Depression Screen     12/19/2023   11:40 AM 08/08/2023    9:27 AM 02/14/2023   10:44 AM 11/15/2022    1:06 PM 10/12/2022   11:12 AM 04/04/2022   10:23 AM 03/30/2021    8:50 AM  PHQ 2/9 Scores  PHQ - 2 Score 0 0 0 0 0 0 0  PHQ- 9 Score  0         Fall Risk     12/19/2023   11:41 AM 08/08/2023    9:27 AM 02/14/2023   10:44 AM 11/11/2022    8:29 PM 10/12/2022   11:13 AM  Fall Risk  Falls in the past year? 0 0 0 0 0  Number falls in past yr: 0 0 0 0 0  Injury with Fall? 0 0 0 0 0  Risk for fall due to : No Fall Risks No Fall Risks No Fall Risks No Fall Risks No Fall Risks  Follow up Falls prevention discussed;Education provided;Falls evaluation completed Falls evaluation completed Falls evaluation completed Falls evaluation completed;Education provided     MEDICARE RISK AT HOME:  Medicare Risk at Home Any stairs in or around the home?: No If so, are there any without handrails?: No Home free of loose throw rugs in walkways, pet beds, electrical cords, etc?: Yes Adequate lighting in your home to reduce risk of falls?: Yes Life alert?: No Use of a cane, walker or w/c?: No Grab bars in the bathroom?: Yes Shower chair or bench in shower?: No Elevated toilet seat or a handicapped toilet?: Yes  TIMED UP AND GO:  Was the test performed?  No  Cognitive Function: 6CIT completed        12/19/2023   11:41 AM 11/15/2022    1:10 PM  6CIT Screen  What Year? 0 points 0 points  What month? 0 points 0 points  What time? 0 points 0 points  Count back from 20 0 points 0 points  Months in reverse 0 points 0 points  Repeat phrase 0 points 0 points  Total Score 0 points 0 points    Immunizations Immunization History  Administered Date(s) Administered   Influenza,inj,quad, With Preservative 06/21/2017   Influenza-Unspecified 06/22/2021, 06/28/2022, 06/25/2023   PNEUMOCOCCAL  CONJUGATE-20 07/21/2021   Pneumococcal Polysaccharide-23 12/17/2020   Zoster Recombinant(Shingrix) 10/31/2021, 04/24/2022    Screening Tests Health Maintenance  Topic Date Due   DTaP/Tdap/Td (1 - Tdap) Never done   COVID-19 Vaccine (1 - 2024-25 season) Never done   MAMMOGRAM  03/08/2024   INFLUENZA VACCINE  04/03/2024   Medicare Annual Wellness (AWV)  12/18/2024   Colonoscopy  05/08/2026   Pneumonia Vaccine 6+ Years old  Completed   DEXA SCAN  Completed   Hepatitis C Screening  Completed   Zoster Vaccines- Shingrix  Completed   HPV VACCINES  Aged Out   Meningococcal B Vaccine  Aged Out    Health Maintenance  Health Maintenance Due  Topic Date Due   DTaP/Tdap/Td (1 - Tdap) Never done   COVID-19 Vaccine (1 - 2024-25 season) Never done    Additional Screening:  Vision Screening: Recommended annual ophthalmology exams for early detection of glaucoma and other disorders of the eye.  Dental Screening: Recommended annual dental exams for proper oral hygiene  Community Resource Referral / Chronic Care Management: CRR required this visit?  No   CCM required this visit?  No     Plan:     I have personally reviewed and noted the following in the patient's chart:   Medical and social history Use of alcohol, tobacco or illicit drugs  Current medications and supplements including opioid prescriptions. Patient is not currently taking opioid prescriptions. Functional ability and status Nutritional status Physical activity Advanced directives List of other physicians Hospitalizations, surgeries, and ER visits in previous 12 months Vitals Screenings to include cognitive, depression, and falls Referrals and appointments  In addition, I have reviewed and discussed with patient certain preventive protocols, quality metrics, and best practice recommendations. A written personalized care plan for preventive services as well as general preventive health recommendations were  provided to patient.     Annetta Killian, LPN  12/19/2023   After Visit Summary: (MyChart) Due to this being a telephonic visit, the after visit summary with patients personalized plan was offered to patient via MyChart   Notes: Nothing significant to report at this time.

## 2023-12-19 NOTE — Patient Instructions (Signed)
 Ms. Molly Carroll , Thank you for taking time to come for your Medicare Wellness Visit. I appreciate your ongoing commitment to your health goals. Please review the following plan we discussed and let me know if I can assist you in the future.   Referrals/Orders/Follow-Ups/Clinician Recommendations: Aim for 30 minutes of exercise or brisk walking, 6-8 glasses of water, and 5 servings of fruits and vegetables each day.  This is a list of the screening recommended for you and due dates:  Health Maintenance  Topic Date Due   DTaP/Tdap/Td vaccine (1 - Tdap) Never done   COVID-19 Vaccine (1 - 2024-25 season) Never done   Mammogram  03/08/2024   Flu Shot  04/03/2024   Medicare Annual Wellness Visit  12/18/2024   Colon Cancer Screening  05/08/2026   Pneumonia Vaccine  Completed   DEXA scan (bone density measurement)  Completed   Hepatitis C Screening  Completed   Zoster (Shingles) Vaccine  Completed   HPV Vaccine  Aged Out   Meningitis B Vaccine  Aged Out    Advanced directives: (ACP Link)Information on Advanced Care Planning can be found at [employer] Advance Health Care Directives Advance Health Care Directives. [email or web address]   Next Medicare Annual Wellness Visit scheduled for next year: Yes

## 2024-01-07 ENCOUNTER — Other Ambulatory Visit: Payer: Self-pay | Admitting: Physician Assistant

## 2024-01-07 DIAGNOSIS — E038 Other specified hypothyroidism: Secondary | ICD-10-CM

## 2024-01-31 ENCOUNTER — Other Ambulatory Visit: Payer: Self-pay | Admitting: Physician Assistant

## 2024-01-31 DIAGNOSIS — E782 Mixed hyperlipidemia: Secondary | ICD-10-CM

## 2024-02-01 ENCOUNTER — Other Ambulatory Visit: Payer: Self-pay | Admitting: Physician Assistant

## 2024-02-01 DIAGNOSIS — E038 Other specified hypothyroidism: Secondary | ICD-10-CM

## 2024-02-05 NOTE — Progress Notes (Unsigned)
 Subjective:  Patient ID: Molly Carroll, female    DOB: 12-Oct-1955  Age: 68 y.o. MRN: [id number]  No chief complaint on file.   HPI:     12/19/2023   11:40 AM 08/08/2023    9:27 AM 02/14/2023   10:44 AM 11/15/2022    1:06 PM 10/12/2022   11:12 AM  Depression screen PHQ 2/9  Decreased Interest 0 0 0 0 0  Down, Depressed, Hopeless 0 0 0 0 0  PHQ - 2 Score 0 0 0 0 0  Altered sleeping  0     Tired, decreased energy  0     Change in appetite  0     Feeling bad or failure about yourself   0     Trouble concentrating  0     Moving slowly or fidgety/restless  0     Suicidal thoughts  0     PHQ-9 Score  0     Difficult doing work/chores  Not difficult at all           12/19/2023   11:41 AM  Fall Risk   Falls in the past year? 0  Number falls in past yr: 0  Injury with Fall? 0  Risk for fall due to : No Fall Risks  Follow up Falls prevention discussed;Education provided;Falls evaluation completed    Patient Care Team: Odilia Bennett, [place] as PCP - General (Physician Assistant)   Review of Systems  Constitutional:  Negative for appetite change, fatigue and fever.  HENT:  Negative for congestion, ear pain, sinus pressure and sore throat.   Respiratory:  Negative for cough, chest tightness, shortness of breath and wheezing.   Cardiovascular:  Negative for chest pain and palpitations.  Gastrointestinal:  Negative for abdominal pain, constipation, diarrhea, nausea and vomiting.  Genitourinary:  Negative for dysuria and hematuria.  Musculoskeletal:  Negative for arthralgias, back pain, joint swelling and myalgias.  Skin:  Negative for rash.  Neurological:  Negative for dizziness, weakness and headaches.  Psychiatric/Behavioral:  Negative for dysphoric mood. The patient is not nervous/anxious.     Current Outpatient Medications on File Prior to Visit  Medication Sig Dispense Refill   meloxicam  (MOBIC ) 15 MG tablet Take 1 tablet (15 mg total) by mouth daily. 30 tablet 0   pravastatin   (PRAVACHOL ) 20 MG tablet Take 1 tablet by mouth once daily 90 tablet 0   SYNTHROID  100 MCG tablet Take 1 tablet by mouth once daily 30 tablet 0   No current facility-administered medications on file prior to visit.   Past Medical History:  Diagnosis Date   COVID-19 05/31/2019   Mixed hyperlipidemia    Other specified hypothyroidism    Past Surgical History:  Procedure Laterality Date   CHOLECYSTECTOMY  1991   HYSTERECTOMY ABDOMINAL WITH SALPINGECTOMY  1993   KNEE SURGERY Left 2005    Family History  Problem Relation Age of Onset   Aortic aneurysm Mother    Prostate cancer Father    Social History   Socioeconomic History   Marital status: Married    Spouse name: Not on file   Number of children: 2   Years of education: Not on file   Highest education level: 12th grade  Occupational History   Occupation: Housewife  Tobacco Use   Smoking status: Former    Current packs/day: 0.00    Types: Cigarettes    Quit date: 2007    Years since quitting: 18.4   Smokeless tobacco: Never  Vaping  Use   Vaping status: Never Used  Substance and Sexual Activity   Alcohol use: Not Currently   Drug use: Never   Sexual activity: Yes    Partners: Male  Other Topics Concern   Not on file  Social History Narrative   Not on file   Social Drivers of Health   Financial Resource Strain: Low Risk  (02/03/2024)   Overall Financial Resource Strain (CARDIA)    Difficulty of Paying Living Expenses: Not hard at all  Food Insecurity: No Food Insecurity (02/03/2024)   Hunger Vital Sign    Worried About Running Out of Food in the Last Year: Never true    Ran Out of Food in the Last Year: Never true  Transportation Needs: No Transportation Needs (02/03/2024)   PRAPARE - Administrator, Civil Service (Medical): No    Lack of Transportation (Non-Medical): No  Physical Activity: Insufficiently Active (02/03/2024)   Exercise Vital Sign    Days of Exercise per Week: 3 days    Minutes of  Exercise per Session: 30 min  Stress: No Stress Concern Present (02/03/2024)   [employer] of Occupational Health - Occupational Stress Questionnaire    Feeling of Stress : Not at all  Social Connections: Moderately Integrated (02/03/2024)   Social Connection and Isolation Panel [NHANES]    Frequency of Communication with Friends and Family: More than three times a week    Frequency of Social Gatherings with Friends and Family: Once a week    Attends Religious Services: More than 4 times per year    Active Member of [employer] or Organizations: No    Attends Banker Meetings: Never    Marital Status: Married    Objective:  There were no vitals taken for this visit.     12/19/2023   11:36 AM 08/08/2023    9:21 AM 02/14/2023   10:42 AM  BP/Weight  Systolic BP -- 124 130  Diastolic BP -- 68 78  Wt. (Lbs) 165 165 160.2  BMI 30.18 kg/m2 30.18 kg/m2 29.3 kg/m2    Physical Exam  Diabetic Foot Exam - Simple   No data filed      Lab Results  Component Value Date   WBC 7.8 08/08/2023   HGB 13.4 08/08/2023   HCT 40.9 08/08/2023   PLT 310 08/08/2023   GLUCOSE 96 08/08/2023   CHOL 217 (H) 08/08/2023   TRIG 152 (H) 08/08/2023   HDL 52 08/08/2023   LDLCALC 138 (H) 08/08/2023   ALT 20 08/08/2023   AST 25 08/08/2023   NA 143 08/08/2023   K 4.8 08/08/2023   CL 102 08/08/2023   CREATININE 0.84 08/08/2023   BUN 19 08/08/2023   CO2 24 08/08/2023   TSH 3.600 09/17/2023   HGBA1C 5.6 10/12/2022      Assessment & Plan:  Mixed hyperlipidemia  Other specified hypothyroidism     No orders of the defined types were placed in this encounter.   No orders of the defined types were placed in this encounter.    Follow-up: No follow-ups on file.   I,Lauren M Auman,acting as a Neurosurgeon for [employer], PA.,have documented all relevant documentation on the behalf of Odilia Bennett, PA,as directed by  Odilia Bennett, PA while in the presence of Odilia Bennett, [place].   An After  Visit Summary was printed and given to the patient.  Odilia Bennett, [place] [facility] [phone]

## 2024-02-06 ENCOUNTER — Encounter: Payer: Self-pay | Admitting: Physician Assistant

## 2024-02-06 ENCOUNTER — Ambulatory Visit (INDEPENDENT_AMBULATORY_CARE_PROVIDER_SITE_OTHER): Payer: No Typology Code available for payment source | Admitting: Physician Assistant

## 2024-02-06 VITALS — BP 138/62 | HR 79 | Temp 97.3°F | Ht 62.0 in | Wt 159.0 lb

## 2024-02-06 DIAGNOSIS — E782 Mixed hyperlipidemia: Secondary | ICD-10-CM | POA: Diagnosis not present

## 2024-02-06 DIAGNOSIS — M2550 Pain in unspecified joint: Secondary | ICD-10-CM | POA: Diagnosis not present

## 2024-02-06 DIAGNOSIS — G8929 Other chronic pain: Secondary | ICD-10-CM | POA: Insufficient documentation

## 2024-02-06 DIAGNOSIS — E038 Other specified hypothyroidism: Secondary | ICD-10-CM | POA: Diagnosis not present

## 2024-02-06 MED ORDER — MELOXICAM 15 MG PO TABS: 15.0000 mg | ORAL_TABLET | Freq: Every day | ORAL | 3 refills | Status: DC

## 2024-02-06 NOTE — Assessment & Plan Note (Signed)
 Experiences daily aches, uses meloxicam  for relief. Kidney function monitoring necessary due to medication metabolism. - Prescribe meloxicam  with a 90-day supply. - Monitor kidney function. - Advise on hydration to support kidney function.

## 2024-02-06 NOTE — Assessment & Plan Note (Signed)
 On Synthroid , prefers brand name. Awaiting blood work for potential medication adjustment. - Await blood work results to assess thyroid function. - Continue Synthroid , consider 6 month supply if stable. - Monitor thyroid function every 6 months.

## 2024-02-06 NOTE — Assessment & Plan Note (Signed)
 LDL cholesterol decreasing with medication and diet. Goal to reach normal LDL for potential medication discontinuation. No significant family history of heart disease, but low LDL crucial to prevent ischemic events. - Continue current cholesterol medication. - Monitor cholesterol levels. - Encourage continuation of diet and weight loss efforts. - Discuss potential discontinuation of medication if LDL reaches normal levels.

## 2024-02-06 NOTE — Patient Instructions (Signed)
 VISIT SUMMARY:  Today, we discussed your progress with managing hyperlipidemia, weight management, osteoarthritis, and hypothyroidism. You have been following a diet and exercise routine, resulting in a weight loss of six pounds since your last visit. We also reviewed your current medications and discussed the importance of regular monitoring and lifestyle changes to support your overall health.  YOUR PLAN:  -HYPERLIPIDEMIA: Hyperlipidemia means having high levels of fats (lipids) in your blood, which can increase the risk of heart disease. Your LDL cholesterol is decreasing with medication and diet. Continue your current cholesterol medication and diet, and we will monitor your cholesterol levels. If your LDL reaches normal levels, we can discuss the possibility of discontinuing the medication.  -WEIGHT MANAGEMENT: You have lost 6 pounds on the Weight Watchers plan and are exercising regularly, which has increased your energy levels. Continue with your current diet and exercise regimen. We will monitor your weight and BMI. Remember to include enough protein in your diet and stay hydrated to prevent hunger and constipation.  -OSTEOARTHRITIS: Osteoarthritis is a condition that causes joint pain and stiffness. You are using meloxicam  for relief, and we need to monitor your kidney function because of the medication. Continue taking meloxicam  as prescribed, and make sure to stay hydrated to support your kidneys.  -HYPOTHYROIDISM: Hypothyroidism is when your thyroid gland doesn't produce enough hormones, leading to a slow metabolism. You are taking Synthroid  and prefer the brand name. We are awaiting blood work results to assess your thyroid function. Continue taking Synthroid , and we will consider a 90-day supply if your condition is stable. We will monitor your thyroid function every 6 months.  -GENERAL HEALTH MAINTENANCE: You are actively managing your health through diet, exercise, and medication  adherence. Regular monitoring of blood tests, including cholesterol and A1c, is important for preventing chronic conditions. Continue promoting a healthy lifestyle through diet and exercise.  INSTRUCTIONS:  Please continue with your current medications and lifestyle changes. We will monitor your cholesterol levels, weight, BMI, kidney function, and thyroid function regularly. If your LDL cholesterol reaches normal levels, we can discuss the possibility of discontinuing your cholesterol medication. Ensure you stay hydrated and include enough protein in your diet. We will follow up with blood work results to assess your thyroid function and adjust your medication if necessary.

## 2024-02-10 ENCOUNTER — Other Ambulatory Visit: Payer: Self-pay

## 2024-02-10 DIAGNOSIS — E038 Other specified hypothyroidism: Secondary | ICD-10-CM

## 2024-02-10 LAB — LIPID PANEL
Chol/HDL Ratio: 3.8 ratio (ref 0.0–4.4)
Cholesterol, Total: 174 mg/dL (ref 100–199)
HDL: 46 mg/dL (ref >39)
LDL Chol Calc (NIH): 103 mg/dL — ABNORMAL HIGH (ref 0–99)
Triglycerides: 142 mg/dL (ref 0–149)
VLDL Cholesterol Cal: 25 mg/dL (ref 5–40)

## 2024-02-10 LAB — COMPREHENSIVE METABOLIC PANEL WITH GFR
ALT: 16 IU/L (ref 0–32)
AST: 25 IU/L (ref 0–40)
Albumin: 4.7 g/dL (ref 3.9–4.9)
Alkaline Phosphatase: 75 IU/L (ref 44–121)
BUN/Creatinine Ratio: 15 (ref 12–28)
BUN: 14 mg/dL (ref 8–27)
Bilirubin Total: 0.2 mg/dL (ref 0.0–1.2)
CO2: 23 mmol/L (ref 20–29)
Calcium: 9.7 mg/dL (ref 8.7–10.3)
Chloride: 103 mmol/L (ref 96–106)
Creatinine, Ser: 0.91 mg/dL (ref 0.57–1.00)
Globulin, Total: 2.5 g/dL (ref 1.5–4.5)
Glucose: 89 mg/dL (ref 70–99)
Potassium: 5.3 mmol/L — ABNORMAL HIGH (ref 3.5–5.2)
Sodium: 141 mmol/L (ref 134–144)
Total Protein: 7.2 g/dL (ref 6.0–8.5)
eGFR: 69 mL/min/{1.73_m2} (ref >59)

## 2024-02-10 LAB — CBC WITH DIFFERENTIAL/PLATELET
Basophils Absolute: 0 10*3/uL (ref 0.0–0.2)
Basos: 1 %
EOS (ABSOLUTE): 0 10*3/uL (ref 0.0–0.4)
Eos: 1 %
Hematocrit: 41.7 % (ref 34.0–46.6)
Hemoglobin: 13.7 g/dL (ref 11.1–15.9)
Immature Grans (Abs): 0 10*3/uL (ref 0.0–0.1)
Immature Granulocytes: 0 %
Lymphocytes Absolute: 2.6 10*3/uL (ref 0.7–3.1)
Lymphs: 42 %
MCH: 30.4 pg (ref 26.6–33.0)
MCHC: 32.9 g/dL (ref 31.5–35.7)
MCV: 93 fL (ref 79–97)
Monocytes Absolute: 0.6 10*3/uL (ref 0.1–0.9)
Monocytes: 10 %
Neutrophils Absolute: 2.9 10*3/uL (ref 1.4–7.0)
Neutrophils: 46 %
Platelets: 303 10*3/uL (ref 150–450)
RBC: 4.51 x10E6/uL (ref 3.77–5.28)
RDW: 12.9 % (ref 11.7–15.4)
WBC: 6.2 10*3/uL (ref 3.4–10.8)

## 2024-02-10 LAB — TSH: TSH: 0.988 u[IU]/mL (ref 0.450–4.500)

## 2024-02-10 MED ORDER — SYNTHROID 100 MCG PO TABS: 100.0000 ug | ORAL_TABLET | Freq: Every day | ORAL | 0 refills | Status: DC

## 2024-02-11 ENCOUNTER — Ambulatory Visit: Payer: Self-pay | Admitting: Physician Assistant

## 2024-03-02 ENCOUNTER — Telehealth: Payer: Self-pay | Admitting: Physician Assistant

## 2024-03-02 DIAGNOSIS — E038 Other specified hypothyroidism: Secondary | ICD-10-CM

## 2024-03-02 MED ORDER — SYNTHROID 100 MCG PO TABS
100.0000 ug | ORAL_TABLET | Freq: Every day | ORAL | 0 refills | Status: DC
Start: 2024-03-02 — End: 2024-04-29

## 2024-03-02 NOTE — Telephone Encounter (Signed)
 Copied from CRM [id number]. Topic: Clinical - Medication Refill >> Mar 02, 2024  8:06 AM [place] MATSU wrote: Medication: SYNTHROID  100 MCG tablet  Has the patient contacted their pharmacy? Yes (Agent: If no, request that the patient contact the pharmacy for the refill. If patient does not wish to contact the pharmacy document the reason why and proceed with request.) (Agent: If yes, when and what did the pharmacy advise?)  This is the patient's preferred pharmacy:  [facility] [address] [place], [place] - [address] [facility] [address] [place] [address] Phone: [phone] Fax: [phone]   Is this the correct pharmacy for this prescription? Yes If no, delete pharmacy and type the correct one.   Has the prescription been filled recently? Yes  Is the patient out of the medication? No  Has the patient been seen for an appointment in the last year OR does the patient have an upcoming appointment? Yes  Can we respond through MyChart? No  Agent: Please be advised that Rx refills may take up to 3 business days. We ask that you follow-up with your pharmacy.

## 2024-04-27 ENCOUNTER — Other Ambulatory Visit: Payer: Self-pay | Admitting: Physician Assistant

## 2024-04-27 DIAGNOSIS — E782 Mixed hyperlipidemia: Secondary | ICD-10-CM

## 2024-04-29 ENCOUNTER — Other Ambulatory Visit: Payer: Self-pay | Admitting: Physician Assistant

## 2024-04-29 DIAGNOSIS — E038 Other specified hypothyroidism: Secondary | ICD-10-CM

## 2024-05-28 ENCOUNTER — Other Ambulatory Visit: Payer: Self-pay | Admitting: Physician Assistant

## 2024-05-28 DIAGNOSIS — E038 Other specified hypothyroidism: Secondary | ICD-10-CM

## 2024-06-25 ENCOUNTER — Other Ambulatory Visit: Payer: Self-pay | Admitting: Physician Assistant

## 2024-06-25 DIAGNOSIS — E038 Other specified hypothyroidism: Secondary | ICD-10-CM

## 2024-07-15 ENCOUNTER — Encounter: Payer: Self-pay | Admitting: Physician Assistant

## 2024-07-24 ENCOUNTER — Other Ambulatory Visit: Payer: Self-pay | Admitting: Physician Assistant

## 2024-07-24 DIAGNOSIS — E038 Other specified hypothyroidism: Secondary | ICD-10-CM

## 2024-07-27 ENCOUNTER — Other Ambulatory Visit: Payer: Self-pay | Admitting: Physician Assistant

## 2024-07-27 DIAGNOSIS — E782 Mixed hyperlipidemia: Secondary | ICD-10-CM

## 2024-08-07 ENCOUNTER — Ambulatory Visit: Admitting: Physician Assistant

## 2024-08-20 ENCOUNTER — Ambulatory Visit: Admitting: Physician Assistant

## 2024-08-20 ENCOUNTER — Encounter: Payer: Self-pay | Admitting: Physician Assistant

## 2024-08-20 VITALS — BP 128/72 | HR 76 | Temp 97.8°F | Ht 62.0 in | Wt 163.8 lb

## 2024-08-20 DIAGNOSIS — E038 Other specified hypothyroidism: Secondary | ICD-10-CM

## 2024-08-20 DIAGNOSIS — E782 Mixed hyperlipidemia: Secondary | ICD-10-CM

## 2024-08-20 LAB — CBC WITH DIFFERENTIAL/PLATELET
Basophils Absolute: 0 x10E3/uL (ref 0.0–0.2)
Basos: 1 %
EOS (ABSOLUTE): 0.1 x10E3/uL (ref 0.0–0.4)
Eos: 1 %
Hematocrit: 41.2 % (ref 34.0–46.6)
Hemoglobin: 13.3 g/dL (ref 11.1–15.9)
Immature Grans (Abs): 0 x10E3/uL (ref 0.0–0.1)
Immature Granulocytes: 0 %
Lymphocytes Absolute: 2.9 x10E3/uL (ref 0.7–3.1)
Lymphs: 41 %
MCH: 30 pg (ref 26.6–33.0)
MCHC: 32.3 g/dL (ref 31.5–35.7)
MCV: 93 fL (ref 79–97)
Monocytes Absolute: 0.6 x10E3/uL (ref 0.1–0.9)
Monocytes: 9 %
Neutrophils Absolute: 3.5 x10E3/uL (ref 1.4–7.0)
Neutrophils: 48 %
Platelets: 344 x10E3/uL (ref 150–450)
RBC: 4.44 x10E6/uL (ref 3.77–5.28)
RDW: 12.6 % (ref 11.7–15.4)
WBC: 7.1 x10E3/uL (ref 3.4–10.8)

## 2024-08-20 LAB — COMPREHENSIVE METABOLIC PANEL WITH GFR
ALT: 31 IU/L (ref 0–32)
AST: 27 IU/L (ref 0–40)
Albumin: 4.3 g/dL (ref 3.9–4.9)
Alkaline Phosphatase: 75 IU/L (ref 49–135)
BUN/Creatinine Ratio: 20 (ref 12–28)
BUN: 18 mg/dL (ref 8–27)
Bilirubin Total: 0.4 mg/dL (ref 0.0–1.2)
CO2: 26 mmol/L (ref 20–29)
Calcium: 9.7 mg/dL (ref 8.7–10.3)
Chloride: 101 mmol/L (ref 96–106)
Creatinine, Ser: 0.91 mg/dL (ref 0.57–1.00)
Globulin, Total: 2.9 g/dL (ref 1.5–4.5)
Glucose: 94 mg/dL (ref 70–99)
Potassium: 4.9 mmol/L (ref 3.5–5.2)
Sodium: 140 mmol/L (ref 134–144)
Total Protein: 7.2 g/dL (ref 6.0–8.5)
eGFR: 69 mL/min/1.73 (ref >59)

## 2024-08-20 LAB — LIPID PANEL
Chol/HDL Ratio: 3.7 ratio (ref 0.0–4.4)
Cholesterol, Total: 194 mg/dL (ref 100–199)
HDL: 53 mg/dL (ref >39)
LDL Chol Calc (NIH): 114 mg/dL — ABNORMAL HIGH (ref 0–99)
Triglycerides: 153 mg/dL — ABNORMAL HIGH (ref 0–149)
VLDL Cholesterol Cal: 27 mg/dL (ref 5–40)

## 2024-08-20 LAB — TSH: TSH: 3.54 u[IU]/mL (ref 0.450–4.500)

## 2024-08-20 LAB — T4, FREE: Free T4: 1.39 ng/dL (ref 0.82–1.77)

## 2024-08-20 NOTE — Assessment & Plan Note (Addendum)
 Mixed hyperlipidemia Managed with statins, but she experiences joint pain. Interested in alternatives due to side effects. Cholesterol levels improved but not optimal. Discussed alternatives like Zetia, Jardiance, Synjardy, and Repatha. Emphasized diet and exercise. Explained statins' limitations in plaque removal and potential benefits of Repatha. - Ordered blood work to assess current cholesterol levels. - Discussed alternative medications: Zetia, Jardiance, Synjardy, Repatha. - Encouraged diet and exercise. - Consider trial without statin if cholesterol levels improve with lifestyle changes.

## 2024-08-20 NOTE — Progress Notes (Signed)
 "  Subjective:  Patient ID: Molly Carroll, female    DOB: [birth year]  Age: 68 y.o. MRN: [id number]  Chief Complaint  Patient presents with   Medical Management of Chronic Issues    HPI: Discussed the use of AI scribe software for clinical note transcription with the patient, who gave verbal consent to proceed.  History of Present Illness Molly Carroll is a 68 year old female with thyroid issues and hyperlipidemia who presents for follow-up on weight management and heart palpitations.  She has been managing her weight through diet and exercise, although her diet was less strict during the holidays, resulting in a weight increase from 159 lbs in June to 163 lbs currently. She aims to return to the 140s through resumed dietary efforts and physical activity in January.  She has experienced heart palpitations, which have decreased recently. The palpitations were more frequent one to two weeks ago. She has a history of thyroid issues, initially presenting as hyperthyroidism.  She experiences epistaxis when blowing her nose, which she manages with Neosporin at night. This issue is more prevalent in the winter months.  She is on a statin for cholesterol management but experiences joint pain, which she attributes to the medication. Her cholesterol levels have improved with medication. She has a family history of high blood pressure and is concerned about the long-term effects of statins.          12/19/2023   11:40 AM 08/08/2023    9:27 AM 02/14/2023   10:44 AM 11/15/2022    1:06 PM 10/12/2022   11:12 AM  Depression screen PHQ 2/9  Decreased Interest 0 0 0 0 0  Down, Depressed, Hopeless 0 0 0 0 0  PHQ - 2 Score 0 0 0 0 0  Altered sleeping  0     Tired, decreased energy  0     Change in appetite  0     Feeling bad or failure about yourself   0     Trouble concentrating  0     Moving slowly or fidgety/restless  0     Suicidal thoughts  0     PHQ-9 Score  0      Difficult doing  work/chores  Not difficult at all        Data saved with a previous flowsheet row definition        12/19/2023   11:41 AM  Fall Risk   Falls in the past year? 0  Number falls in past yr: 0  Injury with Fall? 0   Risk for fall due to : No Fall Risks  Follow up Falls prevention discussed;Education provided;Falls evaluation completed     Data saved with a previous flowsheet row definition    Patient Care Team: Milon Cleaves, [place] as PCP - General (Physician Assistant)   Review of Systems  Constitutional:  Negative for appetite change, fatigue and fever.  HENT:  Negative for congestion, ear pain, sinus pressure and sore throat.   Respiratory:  Negative for cough, chest tightness, shortness of breath and wheezing.   Cardiovascular:  Negative for chest pain and palpitations.  Gastrointestinal:  Negative for abdominal pain, constipation, diarrhea, nausea and vomiting.  Genitourinary:  Negative for dysuria and hematuria.  Musculoskeletal:  Negative for arthralgias, back pain, joint swelling and myalgias.  Skin:  Negative for rash.  Neurological:  Negative for dizziness, weakness and headaches.  Psychiatric/Behavioral:  Negative for dysphoric mood. The patient is not nervous/anxious.  Medications Ordered Prior to Encounter[1] Past Medical History:  Diagnosis Date   COVID-19 05/31/2019   Mixed hyperlipidemia    Other specified hypothyroidism    Past Surgical History:  Procedure Laterality Date   CHOLECYSTECTOMY  1991   HYSTERECTOMY ABDOMINAL WITH SALPINGECTOMY  1993   KNEE SURGERY Left 2005    Family History  Problem Relation Age of Onset   Aortic aneurysm Mother    Prostate cancer Father    Social History   Socioeconomic History   Marital status: Married    Spouse name: Not on file   Number of children: 2   Years of education: Not on file   Highest education level: 12th grade  Occupational History   Occupation: Housewife  Tobacco Use   Smoking status: Former     Current packs/day: 0.00    Types: Cigarettes    Quit date: 2007    Years since quitting: 18.9   Smokeless tobacco: Never  Vaping Use   Vaping status: Never Used  Substance and Sexual Activity   Alcohol use: Not Currently   Drug use: Never   Sexual activity: Yes    Partners: Male  Other Topics Concern   Not on file  Social History Narrative   Not on file   Social Drivers of Health   Tobacco Use: Medium Risk (08/20/2024)   Patient History    Smoking Tobacco Use: Former    Smokeless Tobacco Use: Never    Passive Exposure: Not on Actuary Strain: Low Risk (08/18/2024)   Overall Financial Resource Strain (CARDIA)    Difficulty of Paying Living Expenses: Not hard at all  Food Insecurity: No Food Insecurity (08/18/2024)   Epic    Worried About Radiation Protection Practitioner of Food in the Last Year: Never true    Ran Out of Food in the Last Year: Never true  Transportation Needs: No Transportation Needs (08/18/2024)   Epic    Lack of Transportation (Medical): No    Lack of Transportation (Non-Medical): No  Physical Activity: Insufficiently Active (08/18/2024)   Exercise Vital Sign    Days of Exercise per Week: 3 days    Minutes of Exercise per Session: 30 min  Stress: No Stress Concern Present (08/18/2024)   [employer] of Occupational Health - Occupational Stress Questionnaire    Feeling of Stress: Not at all  Social Connections: Moderately Integrated (08/18/2024)   Social Connection and Isolation Panel    Frequency of Communication with Friends and Family: More than three times a week    Frequency of Social Gatherings with Friends and Family: Once a week    Attends Religious Services: More than 4 times per year    Active Member of [employer] or Organizations: No    Attends Banker Meetings: Not on file    Marital Status: Married  Depression (PHQ2-9): Low Risk (12/19/2023)   Depression (PHQ2-9)    PHQ-2 Score: 0  Alcohol Screen: Low Risk (12/19/2023)   Alcohol  Screen    Last Alcohol Screening Score (AUDIT): 0  Housing: Unknown (08/18/2024)   Epic    Unable to Pay for Housing in the Last Year: No    Number of Times Moved in the Last Year: Not on file    Homeless in the Last Year: No  Utilities: Not At Risk (12/19/2023)   [employer] Utilities    Threatened with loss of utilities: No  Health Literacy: Adequate Health Literacy (12/19/2023)   B1300 Health Literacy    Frequency  of need for help with medical instructions: Never    Objective:  BP 128/72 (BP Location: Left Arm, Patient Position: Sitting)   Pulse 76   Temp 97.8 F (36.6 C) (Temporal)   Ht 5' 2 (1.575 m)   Wt 163 lb 12.8 oz (74.3 kg)   SpO2 96%   BMI 29.96 kg/m      08/20/2024    9:37 AM 02/06/2024    8:54 AM 12/19/2023   11:36 AM  BP/Weight  Systolic BP 128 138 --  Diastolic BP 72 62 --  Wt. (Lbs) 163.8 159 165  BMI 29.96 kg/m2 29.08 kg/m2 30.18 kg/m2    Physical Exam Vitals reviewed.  Constitutional:      Appearance: Normal appearance.  Cardiovascular:     Rate and Rhythm: Normal rate and regular rhythm.     Heart sounds: Normal heart sounds.  Pulmonary:     Effort: Pulmonary effort is normal.     Breath sounds: Normal breath sounds.  Abdominal:     General: Bowel sounds are normal.     Palpations: Abdomen is soft.     Tenderness: There is no abdominal tenderness.  Neurological:     Mental Status: She is alert and oriented to person, place, and time.  Psychiatric:        Mood and Affect: Mood normal.        Behavior: Behavior normal.       Lab Results  Component Value Date   WBC 6.2 02/06/2024   HGB 13.7 02/06/2024   HCT 41.7 02/06/2024   PLT 303 02/06/2024   GLUCOSE 89 02/06/2024   CHOL 174 02/06/2024   TRIG 142 02/06/2024   HDL 46 02/06/2024   LDLCALC 103 (H) 02/06/2024   ALT 16 02/06/2024   AST 25 02/06/2024   NA 141 02/06/2024   K 5.3 (H) 02/06/2024   CL 103 02/06/2024   CREATININE 0.91 02/06/2024   BUN 14 02/06/2024   CO2 23 02/06/2024    TSH 0.988 02/06/2024   HGBA1C 5.6 10/12/2022    Results for orders placed or performed in visit on 07/15/24  HM MAMMOGRAPHY   Collection Time: 04/05/23 12:00 AM  Result Value Ref Range   HM Mammogram 0-4 Bi-Rad 0-4 Bi-Rad, Self Reported Normal  .  Assessment & Plan:   Assessment & Plan Mixed hyperlipidemia Mixed hyperlipidemia Managed with statins, but she experiences joint pain. Interested in alternatives due to side effects. Cholesterol levels improved but not optimal. Discussed alternatives like Zetia, Jardiance, Synjardy, and Repatha. Emphasized diet and exercise. Explained statins' limitations in plaque removal and potential benefits of Repatha. - Ordered blood work to assess current cholesterol levels. - Discussed alternative medications: Zetia, Jardiance, Synjardy, Repatha. - Encouraged diet and exercise. - Consider trial without statin if cholesterol levels improve with lifestyle changes.    Other specified hypothyroidism Hypothyroidism Well-managed, but reports intermittent heart palpitations. Thyroid levels may contribute. Discussed weight gain affecting hormone metabolism. Plan to check thyroid levels and electrolytes. - Ordered thyroid function tests and electrolyte panel. - If labs normal and palpitations persist, consider heart rhythm recorder.      Body mass index is 29.96 kg/m.    No orders of the defined types were placed in this encounter.   No orders of the defined types were placed in this encounter.      Follow-up: No follow-ups on file.  An After Visit Summary was printed and given to the patient.   I,Lauren M Auman,acting as a neurosurgeon for  Molly Angles, PA.,have documented all relevant documentation on the behalf of Molly Angles, PA,as directed by  Molly Angles, PA while in the presence of Molly Carroll, [place].    Molly Carroll, [place] [facility] [phone]     [1]  Current Outpatient Medications on File Prior to Visit  Medication Sig  Dispense Refill   pravastatin  (PRAVACHOL ) 20 MG tablet Take 1 tablet by mouth once daily 90 tablet 0   SYNTHROID  100 MCG tablet Take 1 tablet by mouth once daily 90 tablet 2   No current facility-administered medications on file prior to visit.   "

## 2024-08-20 NOTE — Assessment & Plan Note (Addendum)
 Hypothyroidism Well-managed, but reports intermittent heart palpitations. Thyroid levels may contribute. Discussed weight gain affecting hormone metabolism. Plan to check thyroid levels and electrolytes. - Ordered thyroid function tests and electrolyte panel. - If labs normal and palpitations persist, consider heart rhythm recorder.

## 2024-08-26 ENCOUNTER — Ambulatory Visit: Payer: Self-pay | Admitting: Physician Assistant

## 2024-09-02 ENCOUNTER — Other Ambulatory Visit: Payer: Self-pay | Admitting: Physician Assistant

## 2024-09-02 DIAGNOSIS — E038 Other specified hypothyroidism: Secondary | ICD-10-CM

## 2024-09-02 MED ORDER — SYNTHROID 100 MCG PO TABS: 100.0000 ug | ORAL_TABLET | Freq: Every day | ORAL | 2 refills | Status: AC

## 2024-12-24 ENCOUNTER — Ambulatory Visit

## 2025-02-18 ENCOUNTER — Ambulatory Visit: Admitting: Physician Assistant
